# Patient Record
Sex: Female | Born: 1935 | Race: White | Hispanic: No | Smoking: Never smoker
Health system: Southern US, Community
[De-identification: ages and names within clinical notes are randomized; demographics above are authoritative.]

## PROBLEM LIST (undated history)

## (undated) DIAGNOSIS — I480 Paroxysmal atrial fibrillation: Secondary | ICD-10-CM

## (undated) DIAGNOSIS — K219 Gastro-esophageal reflux disease without esophagitis: Secondary | ICD-10-CM

## (undated) DIAGNOSIS — I10 Essential (primary) hypertension: Secondary | ICD-10-CM

## (undated) DIAGNOSIS — I499 Cardiac arrhythmia, unspecified: Secondary | ICD-10-CM

## (undated) DIAGNOSIS — E785 Hyperlipidemia, unspecified: Secondary | ICD-10-CM

## (undated) DIAGNOSIS — M797 Fibromyalgia: Secondary | ICD-10-CM

## (undated) DIAGNOSIS — H409 Unspecified glaucoma: Secondary | ICD-10-CM

## (undated) HISTORY — DX: Essential (primary) hypertension: I10

## (undated) HISTORY — DX: Hyperlipidemia, unspecified: E78.5

## (undated) HISTORY — PX: CHOLECYSTECTOMY: SHX55

## (undated) HISTORY — PX: DILATION AND CURETTAGE OF UTERUS: SHX78

---

## 2001-01-21 ENCOUNTER — Encounter: Payer: Self-pay | Admitting: *Deleted

## 2001-01-21 ENCOUNTER — Emergency Department (HOSPITAL_COMMUNITY): Admission: EM | Admit: 2001-01-21 | Discharge: 2001-01-21 | Payer: Self-pay | Admitting: *Deleted

## 2001-04-14 ENCOUNTER — Other Ambulatory Visit: Admission: RE | Admit: 2001-04-14 | Discharge: 2001-04-14 | Payer: Self-pay | Admitting: Internal Medicine

## 2001-04-15 ENCOUNTER — Encounter: Payer: Self-pay | Admitting: Internal Medicine

## 2001-04-15 ENCOUNTER — Ambulatory Visit (HOSPITAL_COMMUNITY): Admission: RE | Admit: 2001-04-15 | Discharge: 2001-04-15 | Payer: Self-pay | Admitting: Internal Medicine

## 2002-05-27 ENCOUNTER — Other Ambulatory Visit: Admission: RE | Admit: 2002-05-27 | Discharge: 2002-05-27 | Payer: Self-pay | Admitting: Dermatology

## 2003-08-24 ENCOUNTER — Ambulatory Visit (HOSPITAL_COMMUNITY): Admission: RE | Admit: 2003-08-24 | Discharge: 2003-08-24 | Payer: Self-pay | Admitting: Otolaryngology

## 2003-09-28 ENCOUNTER — Ambulatory Visit (HOSPITAL_COMMUNITY): Admission: RE | Admit: 2003-09-28 | Discharge: 2003-09-28 | Payer: Self-pay | Admitting: Otolaryngology

## 2004-06-20 ENCOUNTER — Ambulatory Visit (HOSPITAL_COMMUNITY): Admission: RE | Admit: 2004-06-20 | Discharge: 2004-06-20 | Payer: Self-pay | Admitting: Internal Medicine

## 2005-05-16 ENCOUNTER — Ambulatory Visit: Payer: Self-pay | Admitting: Family Medicine

## 2005-05-31 ENCOUNTER — Ambulatory Visit: Payer: Self-pay | Admitting: Family Medicine

## 2005-06-04 ENCOUNTER — Ambulatory Visit (HOSPITAL_COMMUNITY): Admission: RE | Admit: 2005-06-04 | Discharge: 2005-06-04 | Payer: Self-pay | Admitting: Family Medicine

## 2005-07-06 ENCOUNTER — Ambulatory Visit: Payer: Self-pay | Admitting: Internal Medicine

## 2005-07-24 ENCOUNTER — Ambulatory Visit: Payer: Self-pay | Admitting: Internal Medicine

## 2005-07-24 ENCOUNTER — Ambulatory Visit (HOSPITAL_COMMUNITY): Admission: RE | Admit: 2005-07-24 | Discharge: 2005-07-24 | Payer: Self-pay | Admitting: Internal Medicine

## 2005-08-31 ENCOUNTER — Ambulatory Visit: Payer: Self-pay | Admitting: Family Medicine

## 2005-09-04 ENCOUNTER — Ambulatory Visit (HOSPITAL_COMMUNITY): Admission: RE | Admit: 2005-09-04 | Discharge: 2005-09-04 | Payer: Self-pay | Admitting: Family Medicine

## 2005-09-11 ENCOUNTER — Ambulatory Visit: Payer: Self-pay | Admitting: Internal Medicine

## 2005-11-30 ENCOUNTER — Ambulatory Visit: Payer: Self-pay | Admitting: Family Medicine

## 2006-01-02 ENCOUNTER — Ambulatory Visit (HOSPITAL_COMMUNITY): Admission: RE | Admit: 2006-01-02 | Discharge: 2006-01-02 | Payer: Self-pay | Admitting: Family Medicine

## 2007-02-20 ENCOUNTER — Ambulatory Visit (HOSPITAL_COMMUNITY): Admission: RE | Admit: 2007-02-20 | Discharge: 2007-02-20 | Payer: Self-pay | Admitting: Family Medicine

## 2007-05-19 ENCOUNTER — Ambulatory Visit (HOSPITAL_COMMUNITY): Admission: RE | Admit: 2007-05-19 | Discharge: 2007-05-19 | Payer: Self-pay | Admitting: Ophthalmology

## 2007-06-23 ENCOUNTER — Ambulatory Visit (HOSPITAL_COMMUNITY): Admission: RE | Admit: 2007-06-23 | Discharge: 2007-06-23 | Payer: Self-pay | Admitting: Ophthalmology

## 2008-02-24 ENCOUNTER — Ambulatory Visit (HOSPITAL_COMMUNITY): Admission: RE | Admit: 2008-02-24 | Discharge: 2008-02-24 | Payer: Self-pay | Admitting: Internal Medicine

## 2009-04-28 ENCOUNTER — Ambulatory Visit (HOSPITAL_COMMUNITY): Admission: RE | Admit: 2009-04-28 | Discharge: 2009-04-28 | Payer: Self-pay | Admitting: Internal Medicine

## 2009-11-09 ENCOUNTER — Ambulatory Visit (HOSPITAL_COMMUNITY): Admission: RE | Admit: 2009-11-09 | Discharge: 2009-11-09 | Payer: Self-pay | Admitting: Internal Medicine

## 2011-01-19 NOTE — Consult Note (Signed)
Danielle Vasquez, Danielle Vasquez            ACCOUNT NO.:  [id number]   MEDICAL RECORD NO.:  [id number]          PATIENT TYPE:  AMB   LOCATION:                                FACILITY:  [facility]   PHYSICIAN:  R. Roetta Sessions, M.D. DATE OF BIRTH:  [birth year]   DATE OF CONSULTATION:  07/06/2005  DATE OF DISCHARGE:                                   CONSULTATION   REFERRING PHYSICIAN:  Dorthula Rue. Early Chars, M.D.   REASON FOR CONSULTATION:  Gastroesophageal reflux disease and dysphagia.   HISTORY OF PRESENT ILLNESS:  Danielle Vasquez is a 77 year old, Caucasian female  patient of Dr. Tarri Abernethy who presents today for further evaluation of  above-stated findings.  In addition, she wants to have a screening  colonoscopy.  She has had intermittent gastroesophageal reflux disease  symptoms for a couple of years duration.  She has to be really careful  watching what she eats.  Also, if she overeats, she seems to be more  symptomatic.  She recently saw Dr. Tarri Abernethy to establish care.  She had  been complaining of some trouble swallowing meats and starchy foods feeling  like it gets hung in her throat.  She was also having some indigestion.  She  was started on Prevacid and has noted improvement of these symptoms.  She  denies any nausea, vomiting, abdominal pain, melena or rectal bleeding.  She  is prone to constipation, but usually manages well as long as she watches  her diet.  She has never had a colonoscopy.   In September 2006, she had normal LFTs, TSH and calcium.   CURRENT MEDICATIONS:  1.  Calcium plus D 600 mg b.i.d.  2.  Red yeast rice 600 mg b.i.d.  3.  Vitamin E daily.  4.  Lisinopril/HCTZ 20/25 mg b.i.d.  5.  Fish oil 1200 mg daily.  6.  Vitamin C 1000 daily.  7.  Aspirin 81 mg daily.  8.  Flax seed oil four times a day.  9.  Timolol drops one drop each eye daily.  10. Prevacid 30 mg daily p.r.n.   ALLERGIES:  No known drug allergies.   PAST MEDICAL HISTORY:  1.  Gastroesophageal reflux  disease.  2.  Osteoarthritis.  3.  Hypertension.  4.  Hyperlipidemia.  5.  Status post cholecystectomy.   FAMILY HISTORY:  Mother died in her 65s of kidney failure and diabetes.  Father died at age 78 of MI.  She has a brother and a sister who passed away  from emphysema.  No family history of colorectal cancer.   SOCIAL HISTORY:  She is widowed and a retired Engineer, site.  She has never  been a smoker.  Denies any alcohol use.   REVIEW OF SYSTEMS:  GASTROINTESTINAL:  See HPI.  CARDIOPULMONARY:  No chest  pain or shortness of breath.  CONSTITUTIONAL:  No weight loss.   PHYSICAL EXAMINATION:  VITAL SIGNS:  Weight 126, height 5 feet 1 inch,  temperature 98.2, blood pressure 126/64, pulse 72.  GENERAL:  Pleasant, well-developed, well-nourished, Caucasian female in no  acute distress.  SKIN:  Warm  and dry, no jaundice.  HEENT:  Pupils equal round and reactive to light.  Conjunctivae are pink.  Sclerae nonicteric.  Oropharyngeal mucosa moist and pink.  No lesions,  erythema or exudate.  NECK:  No lymphadenopathy or thyromegaly.  CHEST:  Lungs clear to auscultation.  CARDIAC:  Regular rate and rhythm with normal S1, S2, no murmurs, rubs or  gallops.  ABDOMEN:  Positive bowel sounds.  Soft, nontender, nondistended.  No  organomegaly or masses.  No rebound tenderness or guarding.  No abdominal  bruits or hernias.  EXTREMITIES:  No edema.   IMPRESSION:  Danielle Vasquez is a 77 year old lady with a couple of year history  of intermittent gastroesophageal reflux symptoms as well as dysphagia.  Symptoms have improved on Prevacid therapy.  She has never had an upper  endoscopy.  In addition, she is prone to constipation, but generally manages  fairly well with dietary measures.  She has never had a screening  colonoscopy and is requesting one at this time.   PLAN:  1.  EGD with possible esophageal dilatation.  Colonoscopy in the near      future.  2.  Will have her hold her aspirin x3 days  prior to the procedure.  3.  She will continue Prevacid 30 mg daily.   I would like to thank Dr. Tarri Abernethy for allowing [place] to take part in the  care of this patient.  Dr. Jena Gauss is the co-signer of this note in the  absence of Dr. Karilyn Cota.      Tana Coast, P.AJonathon Bellows, M.D.  Electronically Signed    LL/MEDQ  D:  07/06/2005  T:  07/06/2005  Job:  [id number]

## 2011-01-19 NOTE — Op Note (Signed)
Danielle Vasquez, Danielle Vasquez            ACCOUNT NO.:  [id number]   MEDICAL RECORD NO.:  [id number]          PATIENT TYPE:  AMB   LOCATION:  DAY                           FACILITY:  [facility]   PHYSICIAN:  Lionel December, M.D.    DATE OF BIRTH:  [birth year]   DATE OF PROCEDURE:  07/24/2005  DATE OF DISCHARGE:                                 OPERATIVE REPORT   PROCEDURE:  Esophagogastroduodenoscopy with esophageal dilation followed by  colonoscopy.   INDICATION:  Danielle Vasquez is a 77 year old Caucasian female with chronic GERD  who presents with intermittent solid food dysphagia. While her heartburn to  well-controlled, she has recurrent hoarseness. She is also undergoing a  screening colonoscopy.   Procedure risks were reviewed with the patient, and informed consent was  obtained.   MEDICINES FOR CONSCIOUS SEDATION:  Cetacaine spray for oropharyngeal topical  anesthesia, Demerol 25 mg IV, Versed 4 mg IV in divided dose.   FINDINGS:  Procedure performed in endoscopy suite. The patient's vital signs  and O2 saturation were monitored during the procedures and remained stable.   PROCEDURE #1:  Esophagogastroduodenoscopy. The patient was placed in left  lateral position and Olympus videoscope was passed via oropharynx without  any difficulty into esophagus.   Esophagus. Mucosa of the esophagus was normal. GE junction was at 37 cm and  hiatus at 39. She had focal erythema and noncritical ring at GE junction.   Stomach. It was empty and distended very well with insufflation. Folds of  proximal stomach were normal. Examination of mucosa revealed few antral  erosions but no ulcer crater was found. Pyloric channel was patent.  Angularis, fundus and cardia were examined by retroflexing the scope and  were normal.   Duodenum. Bulbar mucosa was normal. Scope was passed to the second part of  the duodenum where mucosa and folds were normal. Endoscope was withdrawn.   Esophagus was dilated by passing  54-French Maloney dilator to full  insertion. As the dilator was withdrawn, endoscope was passed again, and  ring was noted to have been disrupted, and there was another linear tear at  cervical esophagus indicative of unrecognized esophageal web. Pictures taken  for the record. Endoscope was withdrawn. The patient prepared for procedure  #2.   PROCEDURE #2:  Colonoscopy. Rectal examination performed. No abnormality  noted on external or digital exam. Olympus videoscope was placed in rectum  and advanced under vision into sigmoid colon and beyond. Preparation was  satisfactory. Scope was advanced into cecum which was identified by  appendiceal orifice and ileocecal valve. Pictures taken for the record.  Colonic mucosa was examined carefully on the way out, and no polyps and/or  other abnormalities were noted. Rectal mucosa was normal. Scope was  retroflexed to examine anorectal junction, and moderate-sized hemorrhoids  were noted below the dentate line. Endoscope was straightened and withdrawn.  The patient tolerated the procedure well.   FINAL DIAGNOSIS:  1.  Mild changes of reflux esophagitis limited to GE junction along with      Schatzki's ring which was disrupted by passing 54-French Maloney      dilator.  2.  Esophageal dilation also resulted in linear mucosal tear of cervical      esophagus indicative of esophageal web.  3.  Small sliding hiatal hernia.  4.  Erosive antral gastritis.  5.  Normal colonoscopy except external hemorrhoids.  6.  I suspect her gastroesophageal reflux disease symptoms are not well-      controlled with single-dose PPI.   RECOMMENDATIONS:  1.  H pylori serology be checked today.  2.  Will increase Prevacid to 30 mg b.i.d. She will pick up one month supply      from the office to supplement her prescription.  3.  She will return for OV in one month from now to make sure her GERD      symptoms are well-controlled. Otherwise she will be switched to  another      PPI. She can resume her usual medications.      Lionel December, M.D.  Electronically Signed     NR/MEDQ  D:  07/24/2005  T:  07/24/2005  Job:  [id number]   cc:   Dorthula Rue. Early Chars, MD  Fax: [phone]

## 2011-09-13 DIAGNOSIS — Z23 Encounter for immunization: Secondary | ICD-10-CM | POA: Diagnosis not present

## 2011-09-27 DIAGNOSIS — I10 Essential (primary) hypertension: Secondary | ICD-10-CM | POA: Diagnosis not present

## 2011-09-27 DIAGNOSIS — E119 Type 2 diabetes mellitus without complications: Secondary | ICD-10-CM | POA: Diagnosis not present

## 2011-09-27 DIAGNOSIS — E785 Hyperlipidemia, unspecified: Secondary | ICD-10-CM | POA: Diagnosis not present

## 2011-12-13 DIAGNOSIS — H4010X0 Unspecified open-angle glaucoma, stage unspecified: Secondary | ICD-10-CM | POA: Diagnosis not present

## 2012-01-01 ENCOUNTER — Other Ambulatory Visit (HOSPITAL_COMMUNITY): Payer: Self-pay | Admitting: Internal Medicine

## 2012-01-01 ENCOUNTER — Ambulatory Visit (HOSPITAL_COMMUNITY)
Admission: RE | Admit: 2012-01-01 | Discharge: 2012-01-01 | Disposition: A | Discharge status: Home / Self Care | Payer: Medicare Other | Source: Ambulatory Visit | Attending: Internal Medicine | Admitting: Internal Medicine

## 2012-01-01 DIAGNOSIS — M25569 Pain in unspecified knee: Secondary | ICD-10-CM | POA: Diagnosis not present

## 2012-01-01 DIAGNOSIS — E119 Type 2 diabetes mellitus without complications: Secondary | ICD-10-CM | POA: Diagnosis not present

## 2012-01-01 DIAGNOSIS — I10 Essential (primary) hypertension: Secondary | ICD-10-CM | POA: Diagnosis not present

## 2012-01-01 DIAGNOSIS — E785 Hyperlipidemia, unspecified: Secondary | ICD-10-CM | POA: Diagnosis not present

## 2012-01-01 DIAGNOSIS — M25869 Other specified joint disorders, unspecified knee: Secondary | ICD-10-CM | POA: Diagnosis not present

## 2012-01-01 DIAGNOSIS — M1710 Unilateral primary osteoarthritis, unspecified knee: Secondary | ICD-10-CM | POA: Diagnosis not present

## 2012-01-16 ENCOUNTER — Ambulatory Visit (INDEPENDENT_AMBULATORY_CARE_PROVIDER_SITE_OTHER): Payer: Medicare Other | Admitting: Orthopedic Surgery

## 2012-01-16 ENCOUNTER — Encounter: Payer: Self-pay | Admitting: Orthopedic Surgery

## 2012-01-16 VITALS — BP 128/60 | Ht 61.0 in | Wt 133.0 lb

## 2012-01-16 DIAGNOSIS — M7052 Other bursitis of knee, left knee: Secondary | ICD-10-CM

## 2012-01-16 DIAGNOSIS — IMO0002 Reserved for concepts with insufficient information to code with codable children: Secondary | ICD-10-CM

## 2012-01-16 DIAGNOSIS — M7051 Other bursitis of knee, right knee: Secondary | ICD-10-CM | POA: Insufficient documentation

## 2012-01-16 NOTE — Progress Notes (Signed)
  Subjective:    Danielle Vasquez is a 78 y.o. female who presents bilateral knee pain over the medial aspect of both knees since March of this year. Her symptoms started gradually around relieved by Tylenol and by anti-inflammatory medications. Her symptoms seem to come and go they're worse in the morning she reports warmth and swelling especially on the right side and reports that the right side is worse. She denies catching locking numbness or tingling  Review of systems positive for joint pain and stiffness the other systems are reported as normal  The following portions of the patient's history were reviewed and updated as appropriate: allergies, current medications, past family history, past medical history, past social history, past surgical history and problem list.   Review of Systems Pertinent items are noted in HPI.   Objective:    BP 128/60  Ht 5\' 1"  (1.549 m)  Wt 133 lb (60.328 kg)  BMI 25.13 kg/m2  Vital signs are stable as recorded  General appearance is normal  The patient is alert and oriented x3  The patient's mood and affect are normal  Gait assessment: Normal ambulation no assistive devices The cardiovascular exam reveals normal pulses and temperature without edema swelling.  The lymphatic system is negative for palpable lymph nodes  The sensory exam is normal.  There are no pathologic reflexes.  Balance is normal.   Exam of the right knee Inspection the medial bursa is swollen and tender there is some tenderness along the joint line there is no joint effusion. Range of motion arc is been maintained to greater than 125. The knee is stable strength is normal the skin is intact  The left knee is examined as well full range of motion is noted there is no instability strength is normal skin is intact the medial joint line is mildly tender the medial bursa is also mildly tender there is no joint effusion          Bilateral knee x-rays obtained from the  hospital show degenerative joint disease  Assessment:    Bilateral pes anserine knee bursitis    Plan:    Right knee bursitis injection   Knee  Injection Procedure Note  Pre-operative Diagnosis: bursitis right knee Post-operative Diagnosis: same  Indications: Pain  Anesthesia: Ethyl chloride Procedure Details   The right knee was prepped with ethyl chloride and alcohol and then a 25-gauge needle was used to inject 40 mg Depo-Medrol and 3 cc 1% lidocaine into the right has anserine bursa  No complications were noted sterile dressing was applied

## 2012-01-16 NOTE — Patient Instructions (Signed)
Ice 3 x a day for the next 2 weeks   You have received a steroid shot. 15% of patients experience increased pain at the injection site with in the next 24 hours. This is best treated with ice and tylenol extra strength 2 tabs every 8 hours. If you are still having pain please call the office.

## 2012-07-23 DIAGNOSIS — H538 Other visual disturbances: Secondary | ICD-10-CM | POA: Diagnosis not present

## 2012-07-23 DIAGNOSIS — H2510 Age-related nuclear cataract, unspecified eye: Secondary | ICD-10-CM | POA: Diagnosis not present

## 2012-07-23 DIAGNOSIS — H4010X0 Unspecified open-angle glaucoma, stage unspecified: Secondary | ICD-10-CM | POA: Diagnosis not present

## 2012-08-11 DIAGNOSIS — H40009 Preglaucoma, unspecified, unspecified eye: Secondary | ICD-10-CM | POA: Diagnosis not present

## 2012-08-12 ENCOUNTER — Encounter (HOSPITAL_COMMUNITY): Payer: Self-pay | Admitting: Pharmacy Technician

## 2012-08-18 DIAGNOSIS — H4010X0 Unspecified open-angle glaucoma, stage unspecified: Secondary | ICD-10-CM | POA: Diagnosis not present

## 2012-08-22 DIAGNOSIS — E785 Hyperlipidemia, unspecified: Secondary | ICD-10-CM | POA: Diagnosis not present

## 2012-08-22 DIAGNOSIS — R7309 Other abnormal glucose: Secondary | ICD-10-CM | POA: Diagnosis not present

## 2012-08-22 DIAGNOSIS — R5381 Other malaise: Secondary | ICD-10-CM | POA: Diagnosis not present

## 2012-08-22 DIAGNOSIS — I10 Essential (primary) hypertension: Secondary | ICD-10-CM | POA: Diagnosis not present

## 2012-08-22 DIAGNOSIS — K219 Gastro-esophageal reflux disease without esophagitis: Secondary | ICD-10-CM | POA: Diagnosis not present

## 2012-08-22 NOTE — Patient Instructions (Addendum)
Your procedure is scheduled on: 09/01/2012  Report to [facility] at 800  AM.  Call this number if you have problems the morning of surgery: [phone]   Do not eat food or drink liquids :After Midnight.      Take these medicines the morning of surgery with A SIP OF WATER: lisinopril,toprol,prilosec   Do not wear jewelry, make-up or nail polish.  Do not wear lotions, powders, or perfumes. You may wear deodorant.  Do not shave 48 hours prior to surgery.  Do not bring valuables to the hospital.  Contacts, dentures or bridgework may not be worn into surgery.  Leave suitcase in the car. After surgery it may be brought to your room.  For patients admitted to the hospital, checkout time is 11:00 AM the day of discharge.   Patients discharged the day of surgery will not be allowed to drive home.  :     Please read over the following fact sheets that you were given: Coughing and Deep Breathing, Surgical Site Infection Prevention, Anesthesia Post-op Instructions and Care and Recovery After Surgery    Cataract A cataract is a clouding of the lens of the eye. When a lens becomes cloudy, vision is reduced based on the degree and nature of the clouding. Many cataracts reduce vision to some degree. Some cataracts make people more near-sighted as they develop. Other cataracts increase glare. Cataracts that are ignored and become worse can sometimes look white. The white color can be seen through the pupil. CAUSES   Aging. However, cataracts may occur at any age, even in newborns.   Certain drugs.   Trauma to the eye.   Certain diseases such as diabetes.   Specific eye diseases such as chronic inflammation inside the eye or a sudden attack of a rare form of glaucoma.   Inherited or acquired medical problems.  SYMPTOMS   Gradual, progressive drop in vision in the affected eye.   Severe, rapid visual loss. This most often happens when trauma is the cause.  DIAGNOSIS  To detect a cataract, an  eye doctor examines the lens. Cataracts are best diagnosed with an exam of the eyes with the pupils enlarged (dilated) by drops.  TREATMENT  For an early cataract, vision may improve by using different eyeglasses or stronger lighting. If that does not help your vision, surgery is the only effective treatment. A cataract needs to be surgically removed when vision loss interferes with your everyday activities, such as driving, reading, or watching TV. A cataract may also have to be removed if it prevents examination or treatment of another eye problem. Surgery removes the cloudy lens and usually replaces it with a substitute lens (intraocular lens, IOL).  At a time when both you and your doctor agree, the cataract will be surgically removed. If you have cataracts in both eyes, only one is usually removed at a time. This allows the operated eye to heal and be out of danger from any possible problems after surgery (such as infection or poor wound healing). In rare cases, a cataract may be doing damage to your eye. In these cases, your caregiver may advise surgical removal right away. The vast majority of people who have cataract surgery have better vision afterward. HOME CARE INSTRUCTIONS  If you are not planning surgery, you may be asked to do the following:  Use different eyeglasses.   Use stronger or brighter lighting.   Ask your eye doctor about reducing your medicine dose or changing  medicines if it is thought that a medicine caused your cataract. Changing medicines does not make the cataract go away on its own.   Become familiar with your surroundings. Poor vision can lead to injury. Avoid bumping into things on the affected side. You are at a higher risk for tripping or falling.   Exercise extreme care when driving or operating machinery.   Wear sunglasses if you are sensitive to bright light or experiencing problems with glare.  SEEK IMMEDIATE MEDICAL CARE IF:   You have a worsening or sudden  vision loss.   You notice redness, swelling, or increasing pain in the eye.   You have a fever.  Document Released: 08/20/2005 Document Revised: 08/09/2011 Document Reviewed: 04/13/2011 [facility] Patient Information 2012 [place], [place].PATIENT INSTRUCTIONS POST-ANESTHESIA  IMMEDIATELY FOLLOWING SURGERY:  Do not drive or operate machinery for the first twenty four hours after surgery.  Do not make any important decisions for twenty four hours after surgery or while taking narcotic pain medications or sedatives.  If you develop intractable nausea and vomiting or a severe headache please notify your doctor immediately.  FOLLOW-UP:  Please make an appointment with your surgeon as instructed. You do not need to follow up with anesthesia unless specifically instructed to do so.  WOUND CARE INSTRUCTIONS (if applicable):  Keep a dry clean dressing on the anesthesia/puncture wound site if there is drainage.  Once the wound has quit draining you may leave it open to air.  Generally you should leave the bandage intact for twenty four hours unless there is drainage.  If the epidural site drains for more than 36-48 hours please call the anesthesia department.  QUESTIONS?:  Please feel free to call your physician or the hospital operator if you have any questions, and they will be happy to assist you.

## 2012-08-25 ENCOUNTER — Other Ambulatory Visit: Payer: Self-pay

## 2012-08-25 ENCOUNTER — Encounter (HOSPITAL_COMMUNITY): Payer: Self-pay

## 2012-08-25 ENCOUNTER — Encounter (HOSPITAL_COMMUNITY)
Admission: RE | Admit: 2012-08-25 | Discharge: 2012-08-25 | Disposition: A | Discharge status: Home / Self Care | Payer: Medicare Other | Source: Ambulatory Visit | Attending: Ophthalmology | Admitting: Ophthalmology

## 2012-08-25 DIAGNOSIS — H2510 Age-related nuclear cataract, unspecified eye: Secondary | ICD-10-CM | POA: Diagnosis not present

## 2012-08-25 DIAGNOSIS — I10 Essential (primary) hypertension: Secondary | ICD-10-CM | POA: Diagnosis not present

## 2012-08-25 DIAGNOSIS — H538 Other visual disturbances: Secondary | ICD-10-CM | POA: Diagnosis not present

## 2012-08-25 DIAGNOSIS — Z01812 Encounter for preprocedural laboratory examination: Secondary | ICD-10-CM | POA: Diagnosis not present

## 2012-08-25 DIAGNOSIS — Z01810 Encounter for preprocedural cardiovascular examination: Secondary | ICD-10-CM | POA: Diagnosis not present

## 2012-08-25 HISTORY — DX: Gastro-esophageal reflux disease without esophagitis: K21.9

## 2012-08-25 HISTORY — DX: Cardiac arrhythmia, unspecified: I49.9

## 2012-08-25 HISTORY — DX: Unspecified glaucoma: H40.9

## 2012-08-25 HISTORY — DX: Fibromyalgia: M79.7

## 2012-08-25 LAB — HEMOGLOBIN AND HEMATOCRIT, BLOOD
HCT: 40.5 % (ref 36.0–46.0)
Hemoglobin: 14.5 g/dL (ref 12.0–15.0)

## 2012-08-25 LAB — BASIC METABOLIC PANEL
BUN: 33 mg/dL — ABNORMAL HIGH (ref 6–23)
CO2: 31 mEq/L (ref 19–32)
Calcium: 10.5 mg/dL (ref 8.4–10.5)
Chloride: 100 mEq/L (ref 96–112)
Creatinine, Ser: 1.05 mg/dL (ref 0.50–1.10)
GFR calc Af Amer: 56 mL/min — ABNORMAL LOW (ref >90)
GFR calc non Af Amer: 48 mL/min — ABNORMAL LOW (ref >90)
Glucose, Bld: 125 mg/dL — ABNORMAL HIGH (ref 70–99)
Potassium: 4.1 mEq/L (ref 3.5–5.1)
Sodium: 139 mEq/L (ref 135–145)

## 2012-08-25 MED ORDER — CYCLOPENTOLATE-PHENYLEPHRINE 0.2-1 % OP SOLN
OPHTHALMIC | Status: AC
  Filled 2012-08-25: qty 2

## 2012-09-01 ENCOUNTER — Encounter (HOSPITAL_COMMUNITY): Payer: Self-pay | Admitting: Anesthesiology

## 2012-09-01 ENCOUNTER — Encounter (HOSPITAL_COMMUNITY): Payer: Self-pay

## 2012-09-01 ENCOUNTER — Encounter (HOSPITAL_COMMUNITY)
Admission: RE | Disposition: A | Discharge status: Home / Self Care | Payer: Self-pay | Source: Ambulatory Visit | Attending: Ophthalmology

## 2012-09-01 ENCOUNTER — Ambulatory Visit (HOSPITAL_COMMUNITY)
Admission: RE | Admit: 2012-09-01 | Discharge: 2012-09-01 | Disposition: A | Discharge status: Home / Self Care | Payer: Medicare Other | Source: Ambulatory Visit | Attending: Ophthalmology | Admitting: Ophthalmology

## 2012-09-01 ENCOUNTER — Ambulatory Visit (HOSPITAL_COMMUNITY): Payer: Medicare Other | Admitting: Anesthesiology

## 2012-09-01 DIAGNOSIS — Z01812 Encounter for preprocedural laboratory examination: Secondary | ICD-10-CM | POA: Insufficient documentation

## 2012-09-01 DIAGNOSIS — H2510 Age-related nuclear cataract, unspecified eye: Secondary | ICD-10-CM | POA: Insufficient documentation

## 2012-09-01 DIAGNOSIS — Z01810 Encounter for preprocedural cardiovascular examination: Secondary | ICD-10-CM | POA: Diagnosis not present

## 2012-09-01 DIAGNOSIS — I10 Essential (primary) hypertension: Secondary | ICD-10-CM | POA: Insufficient documentation

## 2012-09-01 DIAGNOSIS — H269 Unspecified cataract: Secondary | ICD-10-CM | POA: Diagnosis not present

## 2012-09-01 DIAGNOSIS — H538 Other visual disturbances: Secondary | ICD-10-CM | POA: Diagnosis not present

## 2012-09-01 HISTORY — PX: CATARACT EXTRACTION W/PHACO: SHX586

## 2012-09-01 SURGERY — PHACOEMULSIFICATION, CATARACT, WITH IOL INSERTION
Anesthesia: Monitor Anesthesia Care | Site: Eye | Laterality: Right | Wound class: Clean

## 2012-09-01 MED ORDER — LIDOCAINE 3.5 % OP GEL OPTIME - NO CHARGE
OPHTHALMIC | Status: DC | PRN
  Administered 2012-09-01: 2 [drp] via OPHTHALMIC

## 2012-09-01 MED ORDER — CYCLOPENTOLATE-PHENYLEPHRINE 0.2-1 % OP SOLN
1.0000 [drp] | Freq: Once | OPHTHALMIC | Status: AC
  Administered 2012-09-01: 1 [drp] via OPHTHALMIC

## 2012-09-01 MED ORDER — MIDAZOLAM HCL 5 MG/5ML IJ SOLN
INTRAMUSCULAR | Status: DC | PRN
  Administered 2012-09-01: 1 mg via INTRAVENOUS

## 2012-09-01 MED ORDER — NA HYALUR & NA CHOND-NA HYALUR 0.55-0.5 ML IO KIT
PACK | INTRAOCULAR | Status: DC | PRN
  Administered 2012-09-01: 1 via OPHTHALMIC

## 2012-09-01 MED ORDER — MIDAZOLAM HCL 2 MG/2ML IJ SOLN
INTRAMUSCULAR | Status: AC
  Filled 2012-09-01: qty 2

## 2012-09-01 MED ORDER — BSS IO SOLN
INTRAOCULAR | Status: DC | PRN
  Administered 2012-09-01: 15 mL via INTRAOCULAR

## 2012-09-01 MED ORDER — TETRACAINE 0.5 % OP SOLN OPTIME - NO CHARGE
OPHTHALMIC | Status: DC | PRN
  Administered 2012-09-01: 2 [drp] via OPHTHALMIC

## 2012-09-01 MED ORDER — TETRACAINE HCL 0.5 % OP SOLN
OPHTHALMIC | Status: AC
  Filled 2012-09-01: qty 2

## 2012-09-01 MED ORDER — TETRACAINE HCL 0.5 % OP SOLN
1.0000 [drp] | Freq: Once | OPHTHALMIC | Status: AC
  Administered 2012-09-01: 1 [drp] via OPHTHALMIC

## 2012-09-01 MED ORDER — CYCLOPENTOLATE-PHENYLEPHRINE 0.2-1 % OP SOLN
OPHTHALMIC | Status: AC
  Filled 2012-09-01: qty 2

## 2012-09-01 MED ORDER — LIDOCAINE HCL 3.5 % OP GEL
OPHTHALMIC | Status: AC
  Filled 2012-09-01: qty 5

## 2012-09-01 MED ORDER — EPINEPHRINE HCL 1 MG/ML IJ SOLN
INTRAOCULAR | Status: DC | PRN
  Administered 2012-09-01: 10:00:00

## 2012-09-01 MED ORDER — LACTATED RINGERS IV SOLN
INTRAVENOUS | Status: DC
  Administered 2012-09-01: 09:00:00 via INTRAVENOUS

## 2012-09-01 MED ORDER — KETOROLAC TROMETHAMINE 0.5 % OP SOLN
1.0000 [drp] | Freq: Once | OPHTHALMIC | Status: AC
  Administered 2012-09-01: 1 [drp] via OPHTHALMIC

## 2012-09-01 MED ORDER — GATIFLOXACIN 0.5 % OP SOLN OPTIME - NO CHARGE
OPHTHALMIC | Status: DC | PRN
  Administered 2012-09-01: 1 [drp] via OPHTHALMIC

## 2012-09-01 MED ORDER — MIDAZOLAM HCL 2 MG/2ML IJ SOLN
1.0000 mg | INTRAMUSCULAR | Status: DC | PRN
  Administered 2012-09-01: 2 mg via INTRAVENOUS
  Administered 2012-09-01: 1 mg via INTRAVENOUS

## 2012-09-01 MED ORDER — LIDOCAINE HCL 3.5 % OP GEL: 1.0000 "application " | Freq: Once | OPHTHALMIC | Status: DC

## 2012-09-01 MED ORDER — EPINEPHRINE HCL 1 MG/ML IJ SOLN
INTRAMUSCULAR | Status: AC
  Filled 2012-09-01: qty 1

## 2012-09-01 MED ORDER — GATIFLOXACIN 0.5 % OP SOLN
1.0000 [drp] | Freq: Once | OPHTHALMIC | Status: AC
  Administered 2012-09-01: 1 [drp] via OPHTHALMIC

## 2012-09-01 SURGICAL SUPPLY — 8 items
CLOTH BEACON ORANGE TIMEOUT ST (SAFETY) ×1 IMPLANT
GLOVE BIOGEL PI IND STRL 6.5 (GLOVE) IMPLANT
GLOVE BIOGEL PI INDICATOR 6.5 (GLOVE) ×1
GLOVE EXAM NITRILE MD LF STRL (GLOVE) ×1 IMPLANT
INST SET CATARACT ~~LOC~~ (KITS) ×2 IMPLANT
PAD ARMBOARD 7.5X6 YLW CONV (MISCELLANEOUS) ×1 IMPLANT
SIGHTPATH CAT PROC W REG LENS (Ophthalmic Related) ×2 IMPLANT
WATER STERILE IRR 250ML POUR (IV SOLUTION) ×1 IMPLANT

## 2012-09-01 NOTE — Anesthesia Postprocedure Evaluation (Signed)
  Anesthesia Post-op Note  Patient: Danielle Vasquez  Procedure(s) Performed: Procedure(s) (LRB): CATARACT EXTRACTION PHACO AND INTRAOCULAR LENS PLACEMENT (IOC) (Right)  Patient Location:  Short Stay  Anesthesia Type: MAC  Level of Consciousness: awake  Airway and Oxygen Therapy: Patient Spontanous Breathing  Post-op Pain: none  Post-op Assessment: Post-op Vital signs reviewed, Patient's Cardiovascular Status Stable, Respiratory Function Stable, Patent Airway, No signs of Nausea or vomiting and Pain level controlled  Post-op Vital Signs: Reviewed and stable  Complications: No apparent anesthesia complications

## 2012-09-01 NOTE — Anesthesia Procedure Notes (Signed)
Procedure Name: MAC Date/Time: 09/01/2012 9:42 AM Performed by: Franco Nones Pre-anesthesia Checklist: Patient identified, Emergency Drugs available, Suction available, Timeout performed and Patient being monitored Patient Re-evaluated:Patient Re-evaluated prior to inductionOxygen Delivery Method: Nasal Cannula

## 2012-09-01 NOTE — Transfer of Care (Signed)
Immediate Anesthesia Transfer of Care Note  Patient: Danielle Vasquez  Procedure(s) Performed: Procedure(s) (LRB): CATARACT EXTRACTION PHACO AND INTRAOCULAR LENS PLACEMENT (IOC) (Right)  Patient Location: Shortstay  Anesthesia Type: MAC  Level of Consciousness: awake  Airway & Oxygen Therapy: Patient Spontanous Breathing   Post-op Assessment: Report given to PACU RN, Post -op Vital signs reviewed and stable and Patient moving all extremities  Post vital signs: Reviewed and stable  Complications: No apparent anesthesia complications

## 2012-09-01 NOTE — Op Note (Signed)
See scanned op note done today 

## 2012-09-01 NOTE — Anesthesia Preprocedure Evaluation (Signed)
Anesthesia Evaluation  Patient identified by MRN, date of birth, ID band Patient awake    Reviewed: Allergy & Precautions, H&P , NPO status , Patient's Chart, lab work & pertinent test results  Airway Mallampati: II TM Distance: >3 FB     Dental  (+) Edentulous Lower   Pulmonary neg pulmonary ROS,  breath sounds clear to auscultation        Cardiovascular hypertension, Pt. on medications + dysrhythmias Rhythm:Regular     Neuro/Psych  Neuromuscular disease    GI/Hepatic GERD-  Medicated,  Endo/Other    Renal/GU      Musculoskeletal  (+) Fibromyalgia -  Abdominal   Peds  Hematology   Anesthesia Other Findings   Reproductive/Obstetrics                           Anesthesia Physical Anesthesia Plan  ASA: III  Anesthesia Plan: MAC   Post-op Pain Management:    Induction: Intravenous  Airway Management Planned: Nasal Cannula  Additional Equipment:   Intra-op Plan:   Post-operative Plan:   Informed Consent: I have reviewed the patients History and Physical, chart, labs and discussed the procedure including the risks, benefits and alternatives for the proposed anesthesia with the patient or authorized representative who has indicated his/her understanding and acceptance.     Plan Discussed with:   Anesthesia Plan Comments:         Anesthesia Quick Evaluation

## 2012-09-01 NOTE — H&P (Signed)
I have reviewed the pre printed H&P, the patient was re-examined, and I have identified no significant interval changes in the patient's medical condition.  There is no change in the plan of care since the history and physical of record. 

## 2012-09-01 NOTE — Brief Op Note (Signed)
09/01/2012  10:12 AM  PATIENT:  Danielle Vasquez  78 y.o. female  PRE-OPERATIVE DIAGNOSIS:  nuclear cataract right eye  POST-OPERATIVE DIAGNOSIS:  nuclear cataract right eye  PROCEDURE:  Procedure(s): CATARACT EXTRACTION PHACO AND INTRAOCULAR LENS PLACEMENT (IOC)  SURGEON:  Surgeon(s): Susa Simmonds, MD  ASSISTANTS: Marya Landry, CST   ANESTHESIA STAFF: Franco Nones, CRNA - CRNA Laurene Footman, MD - Anesthesiologist  ANESTHESIA:   topical and MAC  REQUESTED LENS POWER: 22.0  LENS IMPLANT INFORMATION:  [employer] [id number]  S/n [id number]  Exp 03/2017  CUMULATIVE DISSIPATED ENERGY:11.36  INDICATIONS:see H&P  OP FINDINGS:dense NS  COMPLICATIONS:None  DICTATION #: see scanned op note  PLAN OF CARE: see H&P  PATIENT DISPOSITION:  Short Stay

## 2012-09-05 ENCOUNTER — Encounter (HOSPITAL_COMMUNITY): Payer: Self-pay | Admitting: Ophthalmology

## 2012-10-29 ENCOUNTER — Encounter (HOSPITAL_COMMUNITY): Payer: Self-pay | Admitting: Pharmacy Technician

## 2012-10-30 ENCOUNTER — Encounter (HOSPITAL_COMMUNITY): Payer: Self-pay

## 2012-10-30 ENCOUNTER — Encounter (HOSPITAL_COMMUNITY)
Admission: RE | Admit: 2012-10-30 | Discharge: 2012-10-30 | Disposition: A | Discharge status: Home / Self Care | Payer: Medicare Other | Source: Ambulatory Visit | Attending: Ophthalmology | Admitting: Ophthalmology

## 2012-10-30 MED ORDER — CYCLOPENTOLATE-PHENYLEPHRINE 0.2-1 % OP SOLN
OPHTHALMIC | Status: AC
  Filled 2012-10-30: qty 2

## 2012-10-30 MED ORDER — FENTANYL CITRATE 0.05 MG/ML IJ SOLN: 25.0000 ug | INTRAMUSCULAR | Status: DC | PRN

## 2012-10-30 MED ORDER — ONDANSETRON HCL 4 MG/2ML IJ SOLN: 4.0000 mg | Freq: Once | INTRAMUSCULAR | Status: AC | PRN

## 2012-10-30 NOTE — Pre-Procedure Instructions (Signed)
Dr Jayme Cloud aware labs done in [facility] to use these.

## 2012-10-30 NOTE — Patient Instructions (Addendum)
Your procedure is scheduled on:  11/03/12  Report to [facility] at 7:00 AM.  Call this number if you have problems the morning of surgery: [phone]   Remember:   Do not eat or drink:After Midnight.  Take these medicines the morning of surgery with A SIP OF WATER: Lisinopril-HCTZ, Metoprolol and Omeprazole.   Do not wear jewelry, make-up or nail polish.  Do not wear lotions, powders, or perfumes. You may wear deodorant.  Do not shave 48 hours prior to surgery. Men may shave face and neck.  Do not bring valuables to the hospital.  Contacts, dentures or bridgework may not be worn into surgery.  Leave suitcase in the car. After surgery it may be brought to your room.  For patients admitted to the hospital, checkout time is 11:00 AM the day of discharge.   Patients discharged the day of surgery will not be allowed to drive home.    Special Instructions: Start using your eye drops before surgery as directed by your eye doctor.   Please read over the following fact sheets that you were given: Anesthesia Post-op Instructions    Cataract Surgery  A cataract is a clouding of the lens of the eye. When a lens becomes cloudy, vision is reduced based on the degree and nature of the clouding. Surgery may be needed to improve vision. Surgery removes the cloudy lens and usually replaces it with a substitute lens (intraocular lens, IOL). LET YOUR EYE DOCTOR KNOW ABOUT:  Allergies to food or medicine.  Medicines taken including herbs, eyedrops, over-the-counter medicines, and creams.  Use of steroids (by mouth or creams).  Previous problems with anesthetics or numbing medicine.  History of bleeding problems or blood clots.  Previous surgery.  Other health problems, including diabetes and kidney problems.  Possibility of pregnancy, if this applies. RISKS AND COMPLICATIONS  Infection.  Inflammation of the eyeball (endophthalmitis) that can spread to both eyes (sympathetic  ophthalmia).  Poor wound healing.  If an IOL is inserted, it can later fall out of proper position. This is very uncommon.  Clouding of the part of your eye that holds an IOL in place. This is called an "after-cataract." These are uncommon, but easily treated. BEFORE THE PROCEDURE  Do not eat or drink anything except small amounts of water for 8 to 12 before your surgery, or as directed by your caregiver.  Unless you are told otherwise, continue any eyedrops you have been prescribed.  Talk to your primary caregiver about all other medicines that you take (both prescription and non-prescription). In some cases, you may need to stop or change medicines near the time of your surgery. This is most important if you are taking blood-thinning medicine.Do not stop medicines unless you are told to do so.  Arrange for someone to drive you to and from the procedure.  Do not put contact lenses in either eye on the day of your surgery. PROCEDURE There is more than one method for safely removing a cataract. Your doctor can explain the differences and help determine which is best for you. Phacoemulsification surgery is the most common form of cataract surgery.  An injection is given behind the eye or eyedrops are given to make this a painless procedure.  A small cut (incision) is made on the edge of the clear, dome-shaped surface that covers the front of the eye (cornea).  A tiny probe is painlessly inserted into the eye. This device gives off ultrasound waves that soften and break  up the cloudy center of the lens. This makes it easier for the cloudy lens to be removed by suction.  An IOL may be implanted.  The normal lens of the eye is covered by a clear capsule. Part of that capsule is intentionally left in the eye to support the IOL.  Your surgeon may or may not use stitches to close the incision. There are other forms of cataract surgery that require a larger incision and stiches to close the  eye. This approach is taken in cases where the doctor feels that the cataract cannot be easily removed using phacoemulsification. AFTER THE PROCEDURE  When an IOL is implanted, it does not need care. It becomes a permanent part of your eye and cannot be seen or felt.  Your doctor will schedule follow-up exams to check on your progress.  Review your other medicines with your doctor to see which can be resumed after surgery.  Use eyedrops or take medicine as prescribed by your doctor. Document Released: 08/09/2011 Document Revised: 11/12/2011 Document Reviewed: 08/09/2011 [facility] Patient Information 2013 [place], [place].    PATIENT INSTRUCTIONS POST-ANESTHESIA  IMMEDIATELY FOLLOWING SURGERY:  Do not drive or operate machinery for the first twenty four hours after surgery.  Do not make any important decisions for twenty four hours after surgery or while taking narcotic pain medications or sedatives.  If you develop intractable nausea and vomiting or a severe headache please notify your doctor immediately.  FOLLOW-UP:  Please make an appointment with your surgeon as instructed. You do not need to follow up with anesthesia unless specifically instructed to do so.  WOUND CARE INSTRUCTIONS (if applicable):  Keep a dry clean dressing on the anesthesia/puncture wound site if there is drainage.  Once the wound has quit draining you may leave it open to air.  Generally you should leave the bandage intact for twenty four hours unless there is drainage.  If the epidural site drains for more than 36-48 hours please call the anesthesia department.  QUESTIONS?:  Please feel free to call your physician or the hospital operator if you have any questions, and they will be happy to assist you.

## 2012-10-31 MED ORDER — TETRACAINE HCL 0.5 % OP SOLN
OPHTHALMIC | Status: AC
  Filled 2012-10-31: qty 2

## 2012-10-31 MED ORDER — TETRACAINE HCL 0.5 % OP SOLN
1.0000 [drp] | Freq: Once | OPHTHALMIC | Status: DC
Start: 2012-10-31 — End: 2012-10-31

## 2012-10-31 MED ORDER — LIDOCAINE HCL 3.5 % OP GEL
OPHTHALMIC | Status: AC
  Filled 2012-10-31: qty 5

## 2012-11-03 ENCOUNTER — Ambulatory Visit (HOSPITAL_COMMUNITY)
Admission: RE | Admit: 2012-11-03 | Discharge: 2012-11-03 | Disposition: A | Discharge status: Home / Self Care | Payer: Medicare Other | Source: Ambulatory Visit | Attending: Ophthalmology | Admitting: Ophthalmology

## 2012-11-03 ENCOUNTER — Ambulatory Visit (HOSPITAL_COMMUNITY): Payer: Medicare Other | Admitting: Anesthesiology

## 2012-11-03 ENCOUNTER — Encounter (HOSPITAL_COMMUNITY): Payer: Self-pay | Admitting: *Deleted

## 2012-11-03 ENCOUNTER — Encounter (HOSPITAL_COMMUNITY)
Admission: RE | Disposition: A | Discharge status: Home / Self Care | Payer: Self-pay | Source: Ambulatory Visit | Attending: Ophthalmology

## 2012-11-03 ENCOUNTER — Encounter (HOSPITAL_COMMUNITY): Payer: Self-pay | Admitting: Anesthesiology

## 2012-11-03 DIAGNOSIS — I10 Essential (primary) hypertension: Secondary | ICD-10-CM | POA: Insufficient documentation

## 2012-11-03 DIAGNOSIS — H269 Unspecified cataract: Secondary | ICD-10-CM | POA: Diagnosis not present

## 2012-11-03 DIAGNOSIS — H2510 Age-related nuclear cataract, unspecified eye: Secondary | ICD-10-CM | POA: Diagnosis not present

## 2012-11-03 DIAGNOSIS — Z79899 Other long term (current) drug therapy: Secondary | ICD-10-CM | POA: Insufficient documentation

## 2012-11-03 HISTORY — PX: CATARACT EXTRACTION W/PHACO: SHX586

## 2012-11-03 SURGERY — PHACOEMULSIFICATION, CATARACT, WITH IOL INSERTION
Anesthesia: Monitor Anesthesia Care | Site: Eye | Laterality: Left | Wound class: Clean

## 2012-11-03 MED ORDER — EPINEPHRINE HCL 1 MG/ML IJ SOLN
INTRAOCULAR | Status: DC | PRN
  Administered 2012-11-03: 08:00:00

## 2012-11-03 MED ORDER — GATIFLOXACIN 0.5 % OP SOLN OPTIME - NO CHARGE
1.0000 [drp] | Freq: Once | OPHTHALMIC | Status: AC
  Administered 2012-11-03: 1 [drp] via OPHTHALMIC
  Filled 2012-11-03: qty 2.5

## 2012-11-03 MED ORDER — EPINEPHRINE HCL 1 MG/ML IJ SOLN
INTRAMUSCULAR | Status: AC
  Filled 2012-11-03: qty 1

## 2012-11-03 MED ORDER — KETOROLAC TROMETHAMINE 0.4 % OP SOLN - NO CHARGE
1.0000 [drp] | Freq: Once | OPHTHALMIC | Status: AC
  Administered 2012-11-03: 1 [drp] via OPHTHALMIC
  Filled 2012-11-03: qty 5

## 2012-11-03 MED ORDER — LIDOCAINE 3.5 % OP GEL OPTIME - NO CHARGE
OPHTHALMIC | Status: DC | PRN
  Administered 2012-11-03: 2 [drp] via OPHTHALMIC

## 2012-11-03 MED ORDER — TETRACAINE 0.5 % OP SOLN OPTIME - NO CHARGE
OPHTHALMIC | Status: DC | PRN
  Administered 2012-11-03: 2 [drp] via OPHTHALMIC

## 2012-11-03 MED ORDER — NA HYALUR & NA CHOND-NA HYALUR 0.55-0.5 ML IO KIT
PACK | INTRAOCULAR | Status: DC | PRN
  Administered 2012-11-03: 1 via OPHTHALMIC

## 2012-11-03 MED ORDER — CYCLOPENTOLATE-PHENYLEPHRINE 0.2-1 % OP SOLN
1.0000 [drp] | Freq: Once | OPHTHALMIC | Status: AC
  Administered 2012-11-03: 1 [drp] via OPHTHALMIC

## 2012-11-03 MED ORDER — BSS IO SOLN
INTRAOCULAR | Status: DC | PRN
  Administered 2012-11-03: 15 mL via INTRAOCULAR

## 2012-11-03 MED ORDER — LACTATED RINGERS IV SOLN
INTRAVENOUS | Status: DC
  Administered 2012-11-03: 08:00:00 via INTRAVENOUS

## 2012-11-03 MED ORDER — MIDAZOLAM HCL 2 MG/2ML IJ SOLN
1.0000 mg | INTRAMUSCULAR | Status: DC | PRN
  Administered 2012-11-03: 2 mg via INTRAVENOUS

## 2012-11-03 MED ORDER — MOXIFLOXACIN HCL 0.5 % OP SOLN - NO CHARGE
1.0000 [drp] | Freq: Once | OPHTHALMIC | Status: DC
  Filled 2012-11-03: qty 3

## 2012-11-03 MED ORDER — MIDAZOLAM HCL 2 MG/2ML IJ SOLN
INTRAMUSCULAR | Status: AC
  Filled 2012-11-03: qty 2

## 2012-11-03 MED ORDER — GATIFLOXACIN 0.5 % OP SOLN OPTIME - NO CHARGE
OPHTHALMIC | Status: DC | PRN
  Administered 2012-11-03: 1 [drp] via OPHTHALMIC

## 2012-11-03 SURGICAL SUPPLY — 27 items
CAPSULAR TENSION RING-AMO (OPHTHALMIC RELATED) IMPLANT
CLOTH BEACON ORANGE TIMEOUT ST (SAFETY) ×1 IMPLANT
GLOVE BIO SURGEON STRL SZ7.5 (GLOVE) IMPLANT
GLOVE BIOGEL M 6.5 STRL (GLOVE) IMPLANT
GLOVE BIOGEL PI IND STRL 6.5 (GLOVE) IMPLANT
GLOVE BIOGEL PI IND STRL 7.0 (GLOVE) IMPLANT
GLOVE BIOGEL PI INDICATOR 6.5 (GLOVE) ×1
GLOVE BIOGEL PI INDICATOR 7.0 (GLOVE)
GLOVE ECLIPSE 6.5 STRL STRAW (GLOVE) IMPLANT
GLOVE ECLIPSE 7.5 STRL STRAW (GLOVE) IMPLANT
GLOVE EXAM NITRILE LRG STRL (GLOVE) IMPLANT
GLOVE EXAM NITRILE MD LF STRL (GLOVE) ×1 IMPLANT
GLOVE SKINSENSE NS SZ6.5 (GLOVE)
GLOVE SKINSENSE NS SZ7.0 (GLOVE)
GLOVE SKINSENSE STRL SZ6.5 (GLOVE) IMPLANT
GLOVE SKINSENSE STRL SZ7.0 (GLOVE) IMPLANT
INST SET CATARACT ~~LOC~~ (KITS) ×2 IMPLANT
KIT VITRECTOMY (OPHTHALMIC RELATED) IMPLANT
PAD ARMBOARD 7.5X6 YLW CONV (MISCELLANEOUS) ×1 IMPLANT
PROC W NO LENS (INTRAOCULAR LENS)
PROC W SPEC LENS (INTRAOCULAR LENS)
PROCESS W NO LENS (INTRAOCULAR LENS) IMPLANT
PROCESS W SPEC LENS (INTRAOCULAR LENS) IMPLANT
RING MALYGIN (MISCELLANEOUS) IMPLANT
SIGHTPATH CAT PROC W REG LENS (Ophthalmic Related) ×2 IMPLANT
VISCOELASTIC ADDITIONAL (OPHTHALMIC RELATED) IMPLANT
WATER STERILE IRR 250ML POUR (IV SOLUTION) ×1 IMPLANT

## 2012-11-03 NOTE — H&P (Signed)
I have reviewed the pre printed H&P, the patient was re-examined, and I have identified no significant interval changes in the patient's medical condition.  There is no change in the plan of care since the history and physical of record. 

## 2012-11-03 NOTE — Anesthesia Preprocedure Evaluation (Addendum)
Anesthesia Evaluation  Patient identified by MRN, date of birth, ID band Patient awake    Reviewed: Allergy & Precautions, H&P , NPO status , Patient's Chart, lab work & pertinent test results  Airway Mallampati: II TM Distance: >3 FB     Dental  (+) Edentulous Lower   Pulmonary neg pulmonary ROS,  breath sounds clear to auscultation        Cardiovascular hypertension, Pt. on medications + dysrhythmias Rhythm:Regular     Neuro/Psych  Neuromuscular disease    GI/Hepatic GERD-  Medicated,  Endo/Other    Renal/GU      Musculoskeletal  (+) Fibromyalgia -  Abdominal   Peds  Hematology   Anesthesia Other Findings   Reproductive/Obstetrics                           Anesthesia Physical Anesthesia Plan  ASA: III  Anesthesia Plan: MAC   Post-op Pain Management:    Induction: Intravenous  Airway Management Planned: Nasal Cannula  Additional Equipment:   Intra-op Plan:   Post-operative Plan:   Informed Consent: I have reviewed the patients History and Physical, chart, labs and discussed the procedure including the risks, benefits and alternatives for the proposed anesthesia with the patient or authorized representative who has indicated his/her understanding and acceptance.     Plan Discussed with:   Anesthesia Plan Comments:         Anesthesia Quick Evaluation  

## 2012-11-03 NOTE — Brief Op Note (Signed)
11/03/2012  9:25 AM  PATIENT:  Candie Echevaria  79 y.o. female  PRE-OPERATIVE DIAGNOSIS:  nuclear cataract left eye  POST-OPERATIVE DIAGNOSIS:  nuclear cataract left eye  PROCEDURE:  Procedure(s): CATARACT EXTRACTION PHACO AND INTRAOCULAR LENS PLACEMENT (IOC)  SURGEON:  Surgeon(s): Susa Simmonds, MD  ASSISTANTS: Marya Landry, CST  ANESTHESIA STAFF: Anesthesiologist: Laurene Footman, MD CRNA: Moshe Salisbury, CRNA  ANESTHESIA:   topical and MAC  REQUESTED LENS POWER: 22.5  LENS IMPLANT INFORMATION:  [employer]con [id number] s/n [id number]  Exp 05/2017  CUMULATIVE DISSIPATED ENERGY:13.74  INDICATIONS:see office H&P  OP FINDINGS:dense NS  COMPLICATIONS:None  DICTATION #: see scanned op note  PLAN OF CARE: KPE w IOL OS  PATIENT DISPOSITION:  Short Stay

## 2012-11-03 NOTE — Anesthesia Postprocedure Evaluation (Signed)
  Anesthesia Post-op Note  Patient: Danielle Vasquez  Procedure(s) Performed: Procedure(s) with comments: CATARACT EXTRACTION PHACO AND INTRAOCULAR LENS PLACEMENT (IOC) (Left) - CDE:  13.74  Patient Location: PACU and Short Stay  Anesthesia Type:MAC  Level of Consciousness: awake, alert  and oriented  Airway and Oxygen Therapy: Patient Spontanous Breathing  Post-op Pain: none  Post-op Assessment: Post-op Vital signs reviewed, Patient's Cardiovascular Status Stable, Respiratory Function Stable, Patent Airway and No signs of Nausea or vomiting  Post-op Vital Signs: Reviewed and stable  Complications: No apparent anesthesia complications

## 2012-11-03 NOTE — Transfer of Care (Signed)
Immediate Anesthesia Transfer of Care Note  Patient: Danielle Vasquez  Procedure(s) Performed: Procedure(s) with comments: CATARACT EXTRACTION PHACO AND INTRAOCULAR LENS PLACEMENT (IOC) (Left) - CDE:  13.74  Patient Location: PACU and Short Stay  Anesthesia Type:MAC  Level of Consciousness: awake  Airway & Oxygen Therapy: Patient Spontanous Breathing  Post-op Assessment: Report given to PACU RN  Post vital signs: Reviewed  Complications: No apparent anesthesia complications

## 2012-11-03 NOTE — Op Note (Signed)
See scanned op note 

## 2012-11-06 ENCOUNTER — Encounter (HOSPITAL_COMMUNITY): Payer: Self-pay | Admitting: Ophthalmology

## 2013-02-20 DIAGNOSIS — E119 Type 2 diabetes mellitus without complications: Secondary | ICD-10-CM | POA: Diagnosis not present

## 2013-02-20 DIAGNOSIS — M1990 Unspecified osteoarthritis, unspecified site: Secondary | ICD-10-CM | POA: Diagnosis not present

## 2013-02-20 DIAGNOSIS — K219 Gastro-esophageal reflux disease without esophagitis: Secondary | ICD-10-CM | POA: Diagnosis not present

## 2013-02-20 DIAGNOSIS — E785 Hyperlipidemia, unspecified: Secondary | ICD-10-CM | POA: Diagnosis not present

## 2013-02-20 DIAGNOSIS — I10 Essential (primary) hypertension: Secondary | ICD-10-CM | POA: Diagnosis not present

## 2013-04-08 DIAGNOSIS — R21 Rash and other nonspecific skin eruption: Secondary | ICD-10-CM | POA: Diagnosis not present

## 2013-04-08 DIAGNOSIS — T7840XA Allergy, unspecified, initial encounter: Secondary | ICD-10-CM | POA: Diagnosis not present

## 2013-05-27 DIAGNOSIS — H43399 Other vitreous opacities, unspecified eye: Secondary | ICD-10-CM | POA: Diagnosis not present

## 2013-05-27 DIAGNOSIS — H4310 Vitreous hemorrhage, unspecified eye: Secondary | ICD-10-CM | POA: Diagnosis not present

## 2013-05-27 DIAGNOSIS — H35419 Lattice degeneration of retina, unspecified eye: Secondary | ICD-10-CM | POA: Diagnosis not present

## 2013-06-22 DIAGNOSIS — H4310 Vitreous hemorrhage, unspecified eye: Secondary | ICD-10-CM | POA: Diagnosis not present

## 2013-07-08 DIAGNOSIS — H4010X0 Unspecified open-angle glaucoma, stage unspecified: Secondary | ICD-10-CM | POA: Diagnosis not present

## 2013-07-08 DIAGNOSIS — Z961 Presence of intraocular lens: Secondary | ICD-10-CM | POA: Diagnosis not present

## 2013-08-20 DIAGNOSIS — E119 Type 2 diabetes mellitus without complications: Secondary | ICD-10-CM | POA: Diagnosis not present

## 2013-08-20 DIAGNOSIS — E785 Hyperlipidemia, unspecified: Secondary | ICD-10-CM | POA: Diagnosis not present

## 2013-08-20 DIAGNOSIS — R7309 Other abnormal glucose: Secondary | ICD-10-CM | POA: Diagnosis not present

## 2013-08-20 DIAGNOSIS — I10 Essential (primary) hypertension: Secondary | ICD-10-CM | POA: Diagnosis not present

## 2013-08-24 DIAGNOSIS — H4010X0 Unspecified open-angle glaucoma, stage unspecified: Secondary | ICD-10-CM | POA: Diagnosis not present

## 2013-08-31 DIAGNOSIS — H4010X0 Unspecified open-angle glaucoma, stage unspecified: Secondary | ICD-10-CM | POA: Diagnosis not present

## 2014-01-12 DIAGNOSIS — H4010X0 Unspecified open-angle glaucoma, stage unspecified: Secondary | ICD-10-CM | POA: Diagnosis not present

## 2014-01-12 DIAGNOSIS — Z961 Presence of intraocular lens: Secondary | ICD-10-CM | POA: Diagnosis not present

## 2014-02-18 DIAGNOSIS — I10 Essential (primary) hypertension: Secondary | ICD-10-CM | POA: Diagnosis not present

## 2014-02-18 DIAGNOSIS — R7309 Other abnormal glucose: Secondary | ICD-10-CM | POA: Diagnosis not present

## 2014-02-18 DIAGNOSIS — E785 Hyperlipidemia, unspecified: Secondary | ICD-10-CM | POA: Diagnosis not present

## 2014-02-22 DIAGNOSIS — I10 Essential (primary) hypertension: Secondary | ICD-10-CM | POA: Diagnosis not present

## 2014-02-22 DIAGNOSIS — E785 Hyperlipidemia, unspecified: Secondary | ICD-10-CM | POA: Diagnosis not present

## 2014-02-22 DIAGNOSIS — E119 Type 2 diabetes mellitus without complications: Secondary | ICD-10-CM | POA: Diagnosis not present

## 2014-04-05 DIAGNOSIS — R946 Abnormal results of thyroid function studies: Secondary | ICD-10-CM | POA: Diagnosis not present

## 2014-04-05 DIAGNOSIS — L821 Other seborrheic keratosis: Secondary | ICD-10-CM | POA: Diagnosis not present

## 2014-04-05 DIAGNOSIS — L259 Unspecified contact dermatitis, unspecified cause: Secondary | ICD-10-CM | POA: Diagnosis not present

## 2014-04-05 DIAGNOSIS — E785 Hyperlipidemia, unspecified: Secondary | ICD-10-CM | POA: Diagnosis not present

## 2014-07-06 DIAGNOSIS — I10 Essential (primary) hypertension: Secondary | ICD-10-CM | POA: Diagnosis not present

## 2014-07-06 DIAGNOSIS — E119 Type 2 diabetes mellitus without complications: Secondary | ICD-10-CM | POA: Diagnosis not present

## 2014-07-08 DIAGNOSIS — I10 Essential (primary) hypertension: Secondary | ICD-10-CM | POA: Diagnosis not present

## 2014-07-08 DIAGNOSIS — E782 Mixed hyperlipidemia: Secondary | ICD-10-CM | POA: Diagnosis not present

## 2014-07-08 DIAGNOSIS — E119 Type 2 diabetes mellitus without complications: Secondary | ICD-10-CM | POA: Diagnosis not present

## 2014-07-08 DIAGNOSIS — K219 Gastro-esophageal reflux disease without esophagitis: Secondary | ICD-10-CM | POA: Diagnosis not present

## 2014-09-21 DIAGNOSIS — H4011X1 Primary open-angle glaucoma, mild stage: Secondary | ICD-10-CM | POA: Diagnosis not present

## 2014-09-21 DIAGNOSIS — Z961 Presence of intraocular lens: Secondary | ICD-10-CM | POA: Diagnosis not present

## 2014-10-04 DIAGNOSIS — H4011X1 Primary open-angle glaucoma, mild stage: Secondary | ICD-10-CM | POA: Diagnosis not present

## 2014-10-11 DIAGNOSIS — R7301 Impaired fasting glucose: Secondary | ICD-10-CM | POA: Diagnosis not present

## 2014-10-11 DIAGNOSIS — Z6824 Body mass index (BMI) 24.0-24.9, adult: Secondary | ICD-10-CM | POA: Diagnosis not present

## 2014-10-11 DIAGNOSIS — K219 Gastro-esophageal reflux disease without esophagitis: Secondary | ICD-10-CM | POA: Diagnosis not present

## 2014-10-11 DIAGNOSIS — H4011X1 Primary open-angle glaucoma, mild stage: Secondary | ICD-10-CM | POA: Diagnosis not present

## 2014-10-11 DIAGNOSIS — L501 Idiopathic urticaria: Secondary | ICD-10-CM | POA: Diagnosis not present

## 2014-11-15 DIAGNOSIS — E119 Type 2 diabetes mellitus without complications: Secondary | ICD-10-CM | POA: Diagnosis not present

## 2014-11-15 DIAGNOSIS — E782 Mixed hyperlipidemia: Secondary | ICD-10-CM | POA: Diagnosis not present

## 2014-11-15 DIAGNOSIS — I10 Essential (primary) hypertension: Secondary | ICD-10-CM | POA: Diagnosis not present

## 2014-11-18 DIAGNOSIS — I10 Essential (primary) hypertension: Secondary | ICD-10-CM | POA: Diagnosis not present

## 2014-11-18 DIAGNOSIS — E119 Type 2 diabetes mellitus without complications: Secondary | ICD-10-CM | POA: Diagnosis not present

## 2014-11-18 DIAGNOSIS — K219 Gastro-esophageal reflux disease without esophagitis: Secondary | ICD-10-CM | POA: Diagnosis not present

## 2014-11-18 DIAGNOSIS — L509 Urticaria, unspecified: Secondary | ICD-10-CM | POA: Diagnosis not present

## 2014-11-18 DIAGNOSIS — E782 Mixed hyperlipidemia: Secondary | ICD-10-CM | POA: Diagnosis not present

## 2015-01-25 ENCOUNTER — Encounter (INDEPENDENT_AMBULATORY_CARE_PROVIDER_SITE_OTHER): Payer: Self-pay | Admitting: *Deleted

## 2015-02-15 ENCOUNTER — Ambulatory Visit (INDEPENDENT_AMBULATORY_CARE_PROVIDER_SITE_OTHER): Payer: TRICARE For Life (TFL) | Admitting: Internal Medicine

## 2015-02-18 DIAGNOSIS — R946 Abnormal results of thyroid function studies: Secondary | ICD-10-CM | POA: Diagnosis not present

## 2015-02-18 DIAGNOSIS — I10 Essential (primary) hypertension: Secondary | ICD-10-CM | POA: Diagnosis not present

## 2015-02-18 DIAGNOSIS — E119 Type 2 diabetes mellitus without complications: Secondary | ICD-10-CM | POA: Diagnosis not present

## 2015-02-18 DIAGNOSIS — E782 Mixed hyperlipidemia: Secondary | ICD-10-CM | POA: Diagnosis not present

## 2015-02-22 DIAGNOSIS — K219 Gastro-esophageal reflux disease without esophagitis: Secondary | ICD-10-CM | POA: Diagnosis not present

## 2015-02-22 DIAGNOSIS — I10 Essential (primary) hypertension: Secondary | ICD-10-CM | POA: Diagnosis not present

## 2015-02-22 DIAGNOSIS — E119 Type 2 diabetes mellitus without complications: Secondary | ICD-10-CM | POA: Diagnosis not present

## 2015-02-22 DIAGNOSIS — E782 Mixed hyperlipidemia: Secondary | ICD-10-CM | POA: Diagnosis not present

## 2015-04-26 DIAGNOSIS — H538 Other visual disturbances: Secondary | ICD-10-CM | POA: Diagnosis not present

## 2015-04-26 DIAGNOSIS — Z961 Presence of intraocular lens: Secondary | ICD-10-CM | POA: Diagnosis not present

## 2015-04-26 DIAGNOSIS — H4011X1 Primary open-angle glaucoma, mild stage: Secondary | ICD-10-CM | POA: Diagnosis not present

## 2015-06-28 DIAGNOSIS — E119 Type 2 diabetes mellitus without complications: Secondary | ICD-10-CM | POA: Diagnosis not present

## 2015-06-30 DIAGNOSIS — I10 Essential (primary) hypertension: Secondary | ICD-10-CM | POA: Diagnosis not present

## 2015-06-30 DIAGNOSIS — E119 Type 2 diabetes mellitus without complications: Secondary | ICD-10-CM | POA: Diagnosis not present

## 2015-06-30 DIAGNOSIS — K219 Gastro-esophageal reflux disease without esophagitis: Secondary | ICD-10-CM | POA: Diagnosis not present

## 2015-06-30 DIAGNOSIS — R944 Abnormal results of kidney function studies: Secondary | ICD-10-CM | POA: Diagnosis not present

## 2015-06-30 DIAGNOSIS — Z23 Encounter for immunization: Secondary | ICD-10-CM | POA: Diagnosis not present

## 2015-06-30 DIAGNOSIS — E782 Mixed hyperlipidemia: Secondary | ICD-10-CM | POA: Diagnosis not present

## 2015-06-30 DIAGNOSIS — M542 Cervicalgia: Secondary | ICD-10-CM | POA: Diagnosis not present

## 2015-10-27 DIAGNOSIS — E119 Type 2 diabetes mellitus without complications: Secondary | ICD-10-CM | POA: Diagnosis not present

## 2015-11-01 DIAGNOSIS — R944 Abnormal results of kidney function studies: Secondary | ICD-10-CM | POA: Diagnosis not present

## 2015-11-01 DIAGNOSIS — E119 Type 2 diabetes mellitus without complications: Secondary | ICD-10-CM | POA: Diagnosis not present

## 2015-11-01 DIAGNOSIS — K219 Gastro-esophageal reflux disease without esophagitis: Secondary | ICD-10-CM | POA: Diagnosis not present

## 2015-11-01 DIAGNOSIS — I10 Essential (primary) hypertension: Secondary | ICD-10-CM | POA: Diagnosis not present

## 2015-11-01 DIAGNOSIS — M542 Cervicalgia: Secondary | ICD-10-CM | POA: Diagnosis not present

## 2015-11-01 DIAGNOSIS — J302 Other seasonal allergic rhinitis: Secondary | ICD-10-CM | POA: Diagnosis not present

## 2015-11-01 DIAGNOSIS — E782 Mixed hyperlipidemia: Secondary | ICD-10-CM | POA: Diagnosis not present

## 2015-11-02 DIAGNOSIS — Z961 Presence of intraocular lens: Secondary | ICD-10-CM | POA: Diagnosis not present

## 2015-11-02 DIAGNOSIS — H538 Other visual disturbances: Secondary | ICD-10-CM | POA: Diagnosis not present

## 2015-11-02 DIAGNOSIS — H401131 Primary open-angle glaucoma, bilateral, mild stage: Secondary | ICD-10-CM | POA: Diagnosis not present

## 2015-11-14 DIAGNOSIS — H401131 Primary open-angle glaucoma, bilateral, mild stage: Secondary | ICD-10-CM | POA: Diagnosis not present

## 2016-01-16 DIAGNOSIS — H401131 Primary open-angle glaucoma, bilateral, mild stage: Secondary | ICD-10-CM | POA: Diagnosis not present

## 2016-04-10 DIAGNOSIS — M9901 Segmental and somatic dysfunction of cervical region: Secondary | ICD-10-CM | POA: Diagnosis not present

## 2016-04-10 DIAGNOSIS — M47812 Spondylosis without myelopathy or radiculopathy, cervical region: Secondary | ICD-10-CM | POA: Diagnosis not present

## 2016-04-12 DIAGNOSIS — M47812 Spondylosis without myelopathy or radiculopathy, cervical region: Secondary | ICD-10-CM | POA: Diagnosis not present

## 2016-04-12 DIAGNOSIS — M9901 Segmental and somatic dysfunction of cervical region: Secondary | ICD-10-CM | POA: Diagnosis not present

## 2016-04-16 DIAGNOSIS — M9901 Segmental and somatic dysfunction of cervical region: Secondary | ICD-10-CM | POA: Diagnosis not present

## 2016-04-16 DIAGNOSIS — M47812 Spondylosis without myelopathy or radiculopathy, cervical region: Secondary | ICD-10-CM | POA: Diagnosis not present

## 2016-04-23 DIAGNOSIS — M47812 Spondylosis without myelopathy or radiculopathy, cervical region: Secondary | ICD-10-CM | POA: Diagnosis not present

## 2016-04-23 DIAGNOSIS — M9901 Segmental and somatic dysfunction of cervical region: Secondary | ICD-10-CM | POA: Diagnosis not present

## 2016-04-27 DIAGNOSIS — M47812 Spondylosis without myelopathy or radiculopathy, cervical region: Secondary | ICD-10-CM | POA: Diagnosis not present

## 2016-04-27 DIAGNOSIS — M9901 Segmental and somatic dysfunction of cervical region: Secondary | ICD-10-CM | POA: Diagnosis not present

## 2016-04-30 DIAGNOSIS — M9901 Segmental and somatic dysfunction of cervical region: Secondary | ICD-10-CM | POA: Diagnosis not present

## 2016-04-30 DIAGNOSIS — M47812 Spondylosis without myelopathy or radiculopathy, cervical region: Secondary | ICD-10-CM | POA: Diagnosis not present

## 2016-05-02 DIAGNOSIS — E119 Type 2 diabetes mellitus without complications: Secondary | ICD-10-CM | POA: Diagnosis not present

## 2016-05-02 DIAGNOSIS — E782 Mixed hyperlipidemia: Secondary | ICD-10-CM | POA: Diagnosis not present

## 2016-05-03 DIAGNOSIS — M47812 Spondylosis without myelopathy or radiculopathy, cervical region: Secondary | ICD-10-CM | POA: Diagnosis not present

## 2016-05-03 DIAGNOSIS — M9901 Segmental and somatic dysfunction of cervical region: Secondary | ICD-10-CM | POA: Diagnosis not present

## 2016-05-04 DIAGNOSIS — I10 Essential (primary) hypertension: Secondary | ICD-10-CM | POA: Diagnosis not present

## 2016-05-04 DIAGNOSIS — E119 Type 2 diabetes mellitus without complications: Secondary | ICD-10-CM | POA: Diagnosis not present

## 2016-05-04 DIAGNOSIS — R51 Headache: Secondary | ICD-10-CM | POA: Diagnosis not present

## 2016-05-04 DIAGNOSIS — J3089 Other allergic rhinitis: Secondary | ICD-10-CM | POA: Diagnosis not present

## 2016-05-04 DIAGNOSIS — M542 Cervicalgia: Secondary | ICD-10-CM | POA: Diagnosis not present

## 2016-05-04 DIAGNOSIS — K219 Gastro-esophageal reflux disease without esophagitis: Secondary | ICD-10-CM | POA: Diagnosis not present

## 2016-05-04 DIAGNOSIS — E782 Mixed hyperlipidemia: Secondary | ICD-10-CM | POA: Diagnosis not present

## 2016-05-04 DIAGNOSIS — R944 Abnormal results of kidney function studies: Secondary | ICD-10-CM | POA: Diagnosis not present

## 2016-05-10 DIAGNOSIS — M47812 Spondylosis without myelopathy or radiculopathy, cervical region: Secondary | ICD-10-CM | POA: Diagnosis not present

## 2016-05-10 DIAGNOSIS — M9901 Segmental and somatic dysfunction of cervical region: Secondary | ICD-10-CM | POA: Diagnosis not present

## 2016-05-17 DIAGNOSIS — H401131 Primary open-angle glaucoma, bilateral, mild stage: Secondary | ICD-10-CM | POA: Diagnosis not present

## 2016-05-17 DIAGNOSIS — M9901 Segmental and somatic dysfunction of cervical region: Secondary | ICD-10-CM | POA: Diagnosis not present

## 2016-05-17 DIAGNOSIS — M47812 Spondylosis without myelopathy or radiculopathy, cervical region: Secondary | ICD-10-CM | POA: Diagnosis not present

## 2016-05-17 DIAGNOSIS — H538 Other visual disturbances: Secondary | ICD-10-CM | POA: Diagnosis not present

## 2016-05-24 DIAGNOSIS — M47812 Spondylosis without myelopathy or radiculopathy, cervical region: Secondary | ICD-10-CM | POA: Diagnosis not present

## 2016-05-24 DIAGNOSIS — M9901 Segmental and somatic dysfunction of cervical region: Secondary | ICD-10-CM | POA: Diagnosis not present

## 2016-05-28 ENCOUNTER — Encounter (HOSPITAL_COMMUNITY)
Admission: RE | Disposition: A | Discharge status: Home / Self Care | Payer: Self-pay | Source: Ambulatory Visit | Attending: Ophthalmology

## 2016-05-28 ENCOUNTER — Ambulatory Visit (HOSPITAL_COMMUNITY)
Admission: RE | Admit: 2016-05-28 | Discharge: 2016-05-28 | Disposition: A | Discharge status: Home / Self Care | Payer: Medicare Other | Source: Ambulatory Visit | Attending: Ophthalmology | Admitting: Ophthalmology

## 2016-05-28 ENCOUNTER — Encounter (HOSPITAL_COMMUNITY): Payer: Self-pay

## 2016-05-28 DIAGNOSIS — I10 Essential (primary) hypertension: Secondary | ICD-10-CM | POA: Insufficient documentation

## 2016-05-28 DIAGNOSIS — K219 Gastro-esophageal reflux disease without esophagitis: Secondary | ICD-10-CM | POA: Insufficient documentation

## 2016-05-28 DIAGNOSIS — H2640 Unspecified secondary cataract: Secondary | ICD-10-CM | POA: Diagnosis not present

## 2016-05-28 DIAGNOSIS — H538 Other visual disturbances: Secondary | ICD-10-CM | POA: Diagnosis not present

## 2016-05-28 DIAGNOSIS — Z79899 Other long term (current) drug therapy: Secondary | ICD-10-CM | POA: Insufficient documentation

## 2016-05-28 DIAGNOSIS — H26491 Other secondary cataract, right eye: Secondary | ICD-10-CM | POA: Diagnosis not present

## 2016-05-28 DIAGNOSIS — M1990 Unspecified osteoarthritis, unspecified site: Secondary | ICD-10-CM | POA: Diagnosis not present

## 2016-05-28 DIAGNOSIS — E7800 Pure hypercholesterolemia, unspecified: Secondary | ICD-10-CM | POA: Diagnosis not present

## 2016-05-28 HISTORY — PX: YAG LASER APPLICATION: SHX6189

## 2016-05-28 SURGERY — TREATMENT, USING YAG LASER
Anesthesia: LOCAL | Laterality: Right

## 2016-05-28 MED ORDER — TETRACAINE HCL 0.5 % OP SOLN
1.0000 [drp] | Freq: Once | OPHTHALMIC | Status: AC
  Administered 2016-05-28: 1 [drp] via OPHTHALMIC

## 2016-05-28 MED ORDER — TROPICAMIDE 1 % OP SOLN
1.0000 [drp] | OPHTHALMIC | Status: AC
  Administered 2016-05-28 (×3): 1 [drp] via OPHTHALMIC

## 2016-05-28 MED ORDER — TETRACAINE HCL 0.5 % OP SOLN
OPHTHALMIC | Status: AC
  Filled 2016-05-28: qty 4

## 2016-05-28 MED ORDER — TROPICAMIDE 1 % OP SOLN
OPHTHALMIC | Status: AC
  Filled 2016-05-28: qty 3

## 2016-05-28 NOTE — Brief Op Note (Signed)
Danielle Vasquez 05/28/2016  [name], MD  Pre-op Diagnosis:  secondary cataract right eye  Post-op Diagnosis:  same  Yag laser self-test completed: Yes.    Indications:  See scanned office H&P for details  Procedure: YAG posterior capsulotomy  right eye  Eye protection worn by staff:  Yes.   Laser In Use sign on door:  Yes.    Laser:  {LUMENIS YAG/SLT LASER  Power Setting:  1.7 mJ/burst Anatomical site treated:  Posterior capsule OD Number of applications:  18 Total energy delivered: 30.6 mJ Results:  Open visual axis OD  Patient was instructed to go to the office, as previously scheduled, for intraocular pressure:  No.  Patient verbalizes understanding of discharge instructions:  Yes.    Notes:  Heavy elschnig's pearls OD  No complications, pt tolerated procedure well.

## 2016-05-28 NOTE — Discharge Instructions (Signed)
Danielle Vasquez  05/28/2016     Instructions    Activity: No Restrictions.   Diet: Resume Diet you were on at home.   Pain Medication: Tylenol if Needed.   CONTACT YOUR DOCTOR IF YOU HAVE PAIN, REDNESS IN YOUR EYE, OR DECREASED VISION.   Follow-up:in 3 weeks with [name], MD.   Dr. [name]: [phone]  Dr. Iona HansenJI:[id number]  Dr. Geoffry ParadiseID:[id number]   If you find that you cannot contact your physician, but feel that your signs and   Symptoms warrant a physician's attention, call the Emergency Room at   [phone] [phone].   Othern/a.  FOLLOW UP APPOINTMENT 06/19/2016 @ 2:45 PM WITH DR [name]

## 2016-05-28 NOTE — H&P (Signed)
I have reviewed the pre printed H&P, the patient was re-examined, and I have identified no significant interval changes in the patient's medical condition.  There is no change in the plan of care since the history and physical of record. 

## 2016-06-04 ENCOUNTER — Encounter (HOSPITAL_COMMUNITY): Payer: Self-pay | Admitting: Ophthalmology

## 2016-06-04 DIAGNOSIS — M47812 Spondylosis without myelopathy or radiculopathy, cervical region: Secondary | ICD-10-CM | POA: Diagnosis not present

## 2016-06-04 DIAGNOSIS — M9901 Segmental and somatic dysfunction of cervical region: Secondary | ICD-10-CM | POA: Diagnosis not present

## 2016-06-19 DIAGNOSIS — M9901 Segmental and somatic dysfunction of cervical region: Secondary | ICD-10-CM | POA: Diagnosis not present

## 2016-06-19 DIAGNOSIS — M47812 Spondylosis without myelopathy or radiculopathy, cervical region: Secondary | ICD-10-CM | POA: Diagnosis not present

## 2016-07-03 DIAGNOSIS — M9901 Segmental and somatic dysfunction of cervical region: Secondary | ICD-10-CM | POA: Diagnosis not present

## 2016-07-03 DIAGNOSIS — M47812 Spondylosis without myelopathy or radiculopathy, cervical region: Secondary | ICD-10-CM | POA: Diagnosis not present

## 2016-07-31 DIAGNOSIS — M9901 Segmental and somatic dysfunction of cervical region: Secondary | ICD-10-CM | POA: Diagnosis not present

## 2016-07-31 DIAGNOSIS — M47812 Spondylosis without myelopathy or radiculopathy, cervical region: Secondary | ICD-10-CM | POA: Diagnosis not present

## 2016-10-23 DIAGNOSIS — M47812 Spondylosis without myelopathy or radiculopathy, cervical region: Secondary | ICD-10-CM | POA: Diagnosis not present

## 2016-10-23 DIAGNOSIS — M9901 Segmental and somatic dysfunction of cervical region: Secondary | ICD-10-CM | POA: Diagnosis not present

## 2016-11-12 DIAGNOSIS — E119 Type 2 diabetes mellitus without complications: Secondary | ICD-10-CM | POA: Diagnosis not present

## 2016-11-12 DIAGNOSIS — I10 Essential (primary) hypertension: Secondary | ICD-10-CM | POA: Diagnosis not present

## 2016-11-15 DIAGNOSIS — R944 Abnormal results of kidney function studies: Secondary | ICD-10-CM | POA: Diagnosis not present

## 2016-11-15 DIAGNOSIS — L603 Nail dystrophy: Secondary | ICD-10-CM | POA: Diagnosis not present

## 2016-11-15 DIAGNOSIS — M542 Cervicalgia: Secondary | ICD-10-CM | POA: Diagnosis not present

## 2016-11-15 DIAGNOSIS — I10 Essential (primary) hypertension: Secondary | ICD-10-CM | POA: Diagnosis not present

## 2016-11-15 DIAGNOSIS — Z0000 Encounter for general adult medical examination without abnormal findings: Secondary | ICD-10-CM | POA: Diagnosis not present

## 2016-11-15 DIAGNOSIS — Z6820 Body mass index (BMI) 20.0-20.9, adult: Secondary | ICD-10-CM | POA: Diagnosis not present

## 2016-11-15 DIAGNOSIS — R51 Headache: Secondary | ICD-10-CM | POA: Diagnosis not present

## 2016-11-15 DIAGNOSIS — K219 Gastro-esophageal reflux disease without esophagitis: Secondary | ICD-10-CM | POA: Diagnosis not present

## 2016-11-15 DIAGNOSIS — E782 Mixed hyperlipidemia: Secondary | ICD-10-CM | POA: Diagnosis not present

## 2016-11-15 DIAGNOSIS — E119 Type 2 diabetes mellitus without complications: Secondary | ICD-10-CM | POA: Diagnosis not present

## 2017-02-05 DIAGNOSIS — I10 Essential (primary) hypertension: Secondary | ICD-10-CM | POA: Diagnosis not present

## 2017-02-05 DIAGNOSIS — R42 Dizziness and giddiness: Secondary | ICD-10-CM | POA: Diagnosis not present

## 2017-02-18 ENCOUNTER — Encounter: Payer: Self-pay | Admitting: Cardiovascular Disease

## 2017-02-22 ENCOUNTER — Encounter: Payer: Self-pay | Admitting: Internal Medicine

## 2017-03-14 DIAGNOSIS — H40003 Preglaucoma, unspecified, bilateral: Secondary | ICD-10-CM | POA: Diagnosis not present

## 2017-03-14 DIAGNOSIS — H40013 Open angle with borderline findings, low risk, bilateral: Secondary | ICD-10-CM | POA: Diagnosis not present

## 2017-03-20 ENCOUNTER — Encounter: Payer: Self-pay | Admitting: *Deleted

## 2017-03-22 ENCOUNTER — Ambulatory Visit (INDEPENDENT_AMBULATORY_CARE_PROVIDER_SITE_OTHER): Payer: Medicare Other | Admitting: Internal Medicine

## 2017-03-22 ENCOUNTER — Encounter: Payer: Self-pay | Admitting: Internal Medicine

## 2017-03-22 VITALS — BP 160/80 | HR 83 | Ht 60.0 in | Wt 108.0 lb

## 2017-03-22 DIAGNOSIS — R002 Palpitations: Secondary | ICD-10-CM | POA: Diagnosis not present

## 2017-03-22 DIAGNOSIS — I10 Essential (primary) hypertension: Secondary | ICD-10-CM

## 2017-03-22 DIAGNOSIS — E782 Mixed hyperlipidemia: Secondary | ICD-10-CM | POA: Diagnosis not present

## 2017-03-22 NOTE — Patient Instructions (Signed)
Your physician recommends that you schedule a follow-up appointment in: follow up with pcp     START Crestor 10 mg daily    No change in other medications      Thank you for choosing [facility] !

## 2017-03-22 NOTE — Progress Notes (Signed)
Cardiology Office Note   Date:  03/22/2017   ID:  Danielle Vasquez, DOB 10/13/1928, MRN [id number]  PCP:  Celene Squibb, MD  Cardiologist:   Dorris Carnes, MD   Pt referred by Dr Nevada Crane for HTN      History of Present Illness: Danielle Vasquez is a 83 y.o. female with a history of HTN  She follows by Dr hall.   She says her BP is up and down  ON one time was it extremely high (over 190)  She says she had one episode of palpitations  Says she was drinking a lot of caffeine that day  Stopped and she has had no further spells    Deneis SOB   NO dizziness       Current Meds  Medication Sig  . aspirin 81 MG tablet Take 81 mg by mouth daily.  . Cholecalciferol (VITAMIN D) 1000 UNITS capsule Take 1,000 Units by mouth daily.  Marland Kitchen lisinopril-hydrochlorothiazide (PRINZIDE,ZESTORETIC) 20-25 MG per tablet Take 2 tablets by mouth daily.  . metoprolol succinate (TOPROL-XL) 25 MG 24 hr tablet Take 25 mg by mouth daily.  . Omega-3 Fatty Acids (FISH OIL) 1200 MG CAPS Take 1,200 mg by mouth daily.   Marland Kitchen omeprazole (PRILOSEC) 20 MG capsule Take 20 mg by mouth daily.  . pravastatin (PRAVACHOL) 40 MG tablet Take 40 mg by mouth at bedtime.     Allergies:   Patient has no known allergies.   Past Medical History:  Diagnosis Date  . Dysrhythmia    fast heart beat"  . Fibromyalgia   . GERD (gastroesophageal reflux disease)   . Glaucoma   . HTN (hypertension)   . Hyperlipemia     Past Surgical History:  Procedure Laterality Date  . CATARACT EXTRACTION W/PHACO  09/01/2012   Procedure: CATARACT EXTRACTION PHACO AND INTRAOCULAR LENS PLACEMENT (IOC);  Surgeon: [name], MD;  Location: [facility];  Service: Ophthalmology;  Laterality: Right;  CDE=11.36  . CATARACT EXTRACTION W/PHACO Left 11/03/2012   Procedure: CATARACT EXTRACTION PHACO AND INTRAOCULAR LENS PLACEMENT (IOC);  Surgeon: [name], MD;  Location: [facility];  Service: Ophthalmology;  Laterality: Left;  CDE:  13.74  .  CHOLECYSTECTOMY    . DILATION AND CURETTAGE OF UTERUS    . YAG LASER APPLICATION Right 8/46/6599   Procedure: YAG LASER APPLICATION;  Surgeon: [name], MD;  Location: [facility];  Service: Ophthalmology;  Laterality: Right;     Social History:  The patient  reports that she has never smoked. She has never used smokeless tobacco. She reports that she does not drink alcohol or use drugs.   Family History:  The patient's family history includes Arthritis in her unknown relative; Heart disease in her unknown relative.    ROS:  Please see the history of present illness. All other systems are reviewed and  Negative to the above problem except as noted.    PHYSICAL EXAM: VS:  BP (!) 160/80 (BP Location: Left Arm)   Pulse 83   Ht 5' (1.524 m)   Wt 108 lb (49 kg)   SpO2 96%   BMI 21.09 kg/m   GEN: Well nourished, well developed, in no acute distress  HEENT: normal  Neck: no JVD, carotid bruits, or masses Cardiac: RRR; no murmurs, rubs, or gallops,no edema  Respiratory:  clear to auscultation bilaterally, normal work of breathing GI: soft, nontender, nondistended, + BS  No hepatomegaly  MS: no deformity Moving all extremities  Skin: warm and dry, no rash Neuro:  Strength and sensation are intact Psych: euthymic mood, full affect   EKG:  EKG is ordered today.  SR 75 bpm     Lipid Panel No results found for: CHOL, TRIG, HDL, CHOLHDL, VLDL, LDLCALC, LDLDIRECT    Wt Readings from Last 3 Encounters:  03/22/17 108 lb (49 kg)  10/30/12 130 lb (59 kg)  08/25/12 130 lb 12.8 oz (59.3 kg)      ASSESSMENT AND PLAN: 1  HTN  BP is a little high today  She says it can go to 110/  Given age, I am reluctant to add meds  May make her hypotensive at times    Follow closely  COnsider very low dose amlodipne if increases  2  HL  Pt with plaquing of carotids in 2006  I would not repeat USN but I would place on statin  Crestor 10 mg  LDL 1was 125  F/U lipids in 8 wks or later with Dr  [place] Crane  3  Palpitations  One spell  No recurrence  Follow  Will be available as needed.      Current medicines are reviewed at length with the patient today.  The patient does not have concerns regarding medicines.  Signed, Dorris Carnes, MD  03/22/2017 1:54 PM    [facility] [address], [place], [place]  [address] Phone: [phone]; Fax: [phone]

## 2017-03-28 ENCOUNTER — Telehealth: Payer: Self-pay | Admitting: Internal Medicine

## 2017-03-28 ENCOUNTER — Other Ambulatory Visit: Payer: Self-pay

## 2017-03-28 MED ORDER — ROSUVASTATIN CALCIUM 10 MG PO TABS: 10.0000 mg | ORAL_TABLET | Freq: Every day | ORAL | 3 refills | Status: DC

## 2017-03-28 NOTE — Telephone Encounter (Signed)
Sent RX in to [facility]. I called pt, but her phone cut off.

## 2017-03-28 NOTE — Telephone Encounter (Signed)
Per pt phone call--pt states she's supposed to be on Crestor per last visit w/ Dr. Harrington Challenger

## 2017-03-29 ENCOUNTER — Other Ambulatory Visit: Payer: Self-pay | Admitting: Adult Health

## 2017-03-29 MED ORDER — ROSUVASTATIN CALCIUM 10 MG PO TABS: 10.0000 mg | ORAL_TABLET | Freq: Every day | ORAL | 3 refills | Status: DC

## 2017-03-29 NOTE — Telephone Encounter (Signed)
Patient needs refill on Crestor sent to [facility] / tg

## 2017-03-29 NOTE — Telephone Encounter (Signed)
Sent to [facility]

## 2017-04-03 ENCOUNTER — Telehealth: Payer: Self-pay | Admitting: Internal Medicine

## 2017-04-03 NOTE — Telephone Encounter (Signed)
Spoke with patient.  She took Crestor 10 mg two evenings in a row.  Both nights woke from palpitations.  Did not take Crestor last night and has not felt any palpitations since.  Palpitations feel the same as that one occurrence she told Dr. Harrington Challenger about but there was no dizziness this time.  I advised to not take further Crestor at this time, but that Dr. Harrington Challenger may recommend decreasing dose/frequency.  Otherwise patient has appointment with Dr. Nevada Crane next month.  She is PRN follow up with cardiology.

## 2017-04-03 NOTE — Telephone Encounter (Signed)
New message   Pt is calling asking to be called back. She states that it is about her medication

## 2017-04-04 DIAGNOSIS — L570 Actinic keratosis: Secondary | ICD-10-CM | POA: Diagnosis not present

## 2017-04-04 DIAGNOSIS — X32XXXA Exposure to sunlight, initial encounter: Secondary | ICD-10-CM | POA: Diagnosis not present

## 2017-04-15 ENCOUNTER — Telehealth: Payer: Self-pay | Admitting: Internal Medicine

## 2017-04-15 NOTE — Telephone Encounter (Signed)
Per patient would like to speak with nurse regarding side effects of new medicaition . / tg

## 2017-04-16 NOTE — Telephone Encounter (Signed)
Returned pt call, advised her to hold the Crestor at this time. I see in chart she has already spoke with another nurse regarding the medication ad is awaiting Dr. Alan Ripper recommendations.

## 2017-04-21 ENCOUNTER — Emergency Department (HOSPITAL_COMMUNITY): Payer: Medicare Other

## 2017-04-21 ENCOUNTER — Emergency Department (HOSPITAL_COMMUNITY)
Admission: EM | Admit: 2017-04-21 | Discharge: 2017-04-21 | Disposition: A | Discharge status: Home / Self Care | Payer: Medicare Other | Attending: Emergency Medicine | Admitting: Emergency Medicine

## 2017-04-21 ENCOUNTER — Encounter (HOSPITAL_COMMUNITY): Payer: Self-pay | Admitting: *Deleted

## 2017-04-21 DIAGNOSIS — R42 Dizziness and giddiness: Secondary | ICD-10-CM | POA: Insufficient documentation

## 2017-04-21 DIAGNOSIS — R002 Palpitations: Secondary | ICD-10-CM

## 2017-04-21 DIAGNOSIS — Z7982 Long term (current) use of aspirin: Secondary | ICD-10-CM | POA: Diagnosis not present

## 2017-04-21 DIAGNOSIS — I4892 Unspecified atrial flutter: Secondary | ICD-10-CM

## 2017-04-21 DIAGNOSIS — Z79899 Other long term (current) drug therapy: Secondary | ICD-10-CM | POA: Insufficient documentation

## 2017-04-21 DIAGNOSIS — I10 Essential (primary) hypertension: Secondary | ICD-10-CM | POA: Diagnosis not present

## 2017-04-21 DIAGNOSIS — I4891 Unspecified atrial fibrillation: Secondary | ICD-10-CM | POA: Diagnosis not present

## 2017-04-21 LAB — URINALYSIS, ROUTINE W REFLEX MICROSCOPIC
BILIRUBIN URINE: NEGATIVE
GLUCOSE, UA: NEGATIVE mg/dL
Hgb urine dipstick: NEGATIVE
KETONES UR: NEGATIVE mg/dL
LEUKOCYTES UA: NEGATIVE
Nitrite: NEGATIVE
PH: 5 (ref 5.0–8.0)
Protein, ur: NEGATIVE mg/dL
Specific Gravity, Urine: 1.012 (ref 1.005–1.030)

## 2017-04-21 LAB — CBC WITH DIFFERENTIAL/PLATELET
BASOS ABS: 0 10*3/uL (ref 0.0–0.1)
Basophils Relative: 1 %
EOS ABS: 0.2 10*3/uL (ref 0.0–0.7)
EOS PCT: 3 %
HCT: 39.9 % (ref 36.0–46.0)
Hemoglobin: 13.6 g/dL (ref 12.0–15.0)
Lymphocytes Relative: 22 %
Lymphs Abs: 1.3 10*3/uL (ref 0.7–4.0)
MCH: 31.8 pg (ref 26.0–34.0)
MCHC: 34.1 g/dL (ref 30.0–36.0)
MCV: 93.2 fL (ref 78.0–100.0)
MONO ABS: 0.6 10*3/uL (ref 0.1–1.0)
Monocytes Relative: 9 %
Neutro Abs: 3.9 10*3/uL (ref 1.7–7.7)
Neutrophils Relative %: 65 %
PLATELETS: 144 10*3/uL — AB (ref 150–400)
RBC: 4.28 MIL/uL (ref 3.87–5.11)
RDW: 13.1 % (ref 11.5–15.5)
WBC: 6 10*3/uL (ref 4.0–10.5)

## 2017-04-21 LAB — TSH: TSH: 6.846 u[IU]/mL — AB (ref 0.350–4.500)

## 2017-04-21 LAB — COMPREHENSIVE METABOLIC PANEL
ALT: 12 U/L — AB (ref 14–54)
AST: 19 U/L (ref 15–41)
Albumin: 3.8 g/dL (ref 3.5–5.0)
Alkaline Phosphatase: 75 U/L (ref 38–126)
Anion gap: 7 (ref 5–15)
BUN: 29 mg/dL — ABNORMAL HIGH (ref 6–20)
CHLORIDE: 107 mmol/L (ref 101–111)
CO2: 27 mmol/L (ref 22–32)
CREATININE: 0.77 mg/dL (ref 0.44–1.00)
Calcium: 9.5 mg/dL (ref 8.9–10.3)
GFR calc non Af Amer: >60 mL/min (ref >60)
Glucose, Bld: 124 mg/dL — ABNORMAL HIGH (ref 65–99)
POTASSIUM: 3.7 mmol/L (ref 3.5–5.1)
SODIUM: 141 mmol/L (ref 135–145)
Total Bilirubin: 0.6 mg/dL (ref 0.3–1.2)
Total Protein: 6.2 g/dL — ABNORMAL LOW (ref 6.5–8.1)

## 2017-04-21 LAB — BRAIN NATRIURETIC PEPTIDE: B Natriuretic Peptide: 140 pg/mL — ABNORMAL HIGH (ref 0.0–100.0)

## 2017-04-21 LAB — TROPONIN I: Troponin I: <0.03 ng/mL (ref <0.03)

## 2017-04-21 MED ORDER — APIXABAN 2.5 MG PO TABS: 2.5000 mg | ORAL_TABLET | Freq: Two times a day (BID) | ORAL | 0 refills | Status: DC

## 2017-04-21 MED ORDER — APIXABAN 2.5 MG PO TABS
2.5000 mg | ORAL_TABLET | Freq: Once | ORAL | Status: AC
  Administered 2017-04-21: 2.5 mg via ORAL
  Filled 2017-04-21 (×2): qty 1

## 2017-04-21 NOTE — ED Provider Notes (Signed)
[facility] DEPT Provider Note   CSN: [id number] Arrival date & time: 04/21/17  7408     History   Chief Complaint Chief Complaint  Patient presents with  . Palpitations    HPI Danielle Vasquez is a 83 y.o. female.  Patient presents with palpitations that woke her from sleep. States they lasted about 15-20 minutes but since resolved. She's had intermittent episodes of lightheadedness and palpitations for the past several months but not been diagnosed with any dysrhythmias. She has a history of hypertension and hyperlipidemia. He sees Dr. Harrington Challenger of cardiology. She was told that her blood pressure goes high when she stands up and there has been adjustments her blood pressure medication but not recently. She denies chest pain, shortness of breath, nausea or vomiting. She endorses some lightheadedness when she stands up. Good by mouth intake and urine output. No history of heart problems is never had a heart attack or stents in her heart. EMS found her to be in atrial flutter and she has since converted to sinus rhythm.   The history is provided by the patient and the EMS personnel.  Palpitations   Associated symptoms include weakness. Pertinent negatives include no chest pain, no abdominal pain, no nausea, no vomiting, no headaches, no dizziness, no cough and no shortness of breath.    Past Medical History:  Diagnosis Date  . Dysrhythmia    fast heart beat"  . Fibromyalgia   . GERD (gastroesophageal reflux disease)   . Glaucoma   . HTN (hypertension)   . Hyperlipemia     Patient Active Problem List   Diagnosis Date Noted  . Pes anserinus bursitis of both knees 01/16/2012    Past Surgical History:  Procedure Laterality Date  . CATARACT EXTRACTION W/PHACO  09/01/2012   Procedure: CATARACT EXTRACTION PHACO AND INTRAOCULAR LENS PLACEMENT (IOC);  Surgeon: [name], MD;  Location: [facility];  Service: Ophthalmology;  Laterality: Right;  CDE=11.36  . CATARACT EXTRACTION  W/PHACO Left 11/03/2012   Procedure: CATARACT EXTRACTION PHACO AND INTRAOCULAR LENS PLACEMENT (IOC);  Surgeon: [name], MD;  Location: [facility];  Service: Ophthalmology;  Laterality: Left;  CDE:  13.74  . CHOLECYSTECTOMY    . DILATION AND CURETTAGE OF UTERUS    . YAG LASER APPLICATION Right 1/44/8185   Procedure: YAG LASER APPLICATION;  Surgeon: [name], MD;  Location: [facility];  Service: Ophthalmology;  Laterality: Right;    OB History    No data available       Home Medications    Prior to Admission medications   Medication Sig Start Date End Date Taking? Authorizing Provider  aspirin 81 MG tablet Take 81 mg by mouth daily.    [provider]  Cholecalciferol (VITAMIN D) 1000 UNITS capsule Take 1,000 Units by mouth daily.    [provider]  lisinopril-hydrochlorothiazide (PRINZIDE,ZESTORETIC) 20-25 MG per tablet Take 2 tablets by mouth daily.    [provider]  metoprolol succinate (TOPROL-XL) 25 MG 24 hr tablet Take 25 mg by mouth daily.    [provider]  Omega-3 Fatty Acids (FISH OIL) 1200 MG CAPS Take 1,200 mg by mouth daily.     [provider]  omeprazole (PRILOSEC) 20 MG capsule Take 20 mg by mouth daily.    [provider]  pravastatin (PRAVACHOL) 40 MG tablet Take 40 mg by mouth at bedtime.    [provider]  rosuvastatin (CRESTOR) 10 MG tablet Take 1 tablet (10 mg total)  by mouth daily. 03/29/17 06/27/17  Fay Records, MD    Family History Family History  Problem Relation Age of Onset  . Heart disease Unknown   . Arthritis Unknown     Social History Social History  Substance Use Topics  . Smoking status: Never Smoker  . Smokeless tobacco: Never Used  . Alcohol use No     Allergies   Patient has no known allergies.   Review of Systems Review of Systems  Constitutional: Negative for activity change and appetite change.  HENT: Negative for congestion and rhinorrhea.     Respiratory: Negative for cough, chest tightness and shortness of breath.   Cardiovascular: Positive for palpitations. Negative for chest pain.  Gastrointestinal: Negative for abdominal pain, nausea and vomiting.  Genitourinary: Negative for dysuria and hematuria.  Musculoskeletal: Negative for arthralgias and myalgias.  Neurological: Positive for weakness and light-headedness. Negative for dizziness and headaches.   all other systems are negative except as noted in the HPI and PMH.     Physical Exam Updated Vital Signs BP (!) 183/75   Pulse 65   Temp 98.1 F (36.7 C) (Oral)   Resp 16   Ht 5\' 1"  (1.549 m)   Wt 49 kg (108 lb)   SpO2 97%   BMI 20.41 kg/m   Physical Exam  Constitutional: She is oriented to person, place, and time. She appears well-developed and well-nourished. No distress.  HENT:  Head: Normocephalic and atraumatic.  Mouth/Throat: Oropharynx is clear and moist. No oropharyngeal exudate.  Eyes: Pupils are equal, round, and reactive to light. Conjunctivae and EOM are normal.  Neck: Normal range of motion. Neck supple.  No meningismus.  Cardiovascular: Normal rate, normal heart sounds and intact distal pulses.   No murmur heard. Irregular rhythm Intact DP and PT pulses  Pulmonary/Chest: Effort normal and breath sounds normal. No respiratory distress.  Abdominal: Soft. There is no tenderness. There is no rebound and no guarding.  Musculoskeletal: Normal range of motion. She exhibits no edema or tenderness.  Neurological: She is alert and oriented to person, place, and time. No cranial nerve deficit. She exhibits normal muscle tone. Coordination normal.  No ataxia on finger to nose bilaterally. No pronator drift. 5/5 strength throughout. CN 2-12 intact.Equal grip strength. Sensation intact.   Skin: Skin is warm.  Psychiatric: She has a normal mood and affect. Her behavior is normal.  Nursing note and vitals reviewed.    ED Treatments / Results  Labs (all  labs ordered are listed, but only abnormal results are displayed) Labs Reviewed  CBC WITH DIFFERENTIAL/PLATELET - Abnormal; Notable for the following:       Result Value   Platelets 144 (*)    All other components within normal limits  COMPREHENSIVE METABOLIC PANEL - Abnormal; Notable for the following:    Glucose, Bld 124 (*)    BUN 29 (*)    Total Protein 6.2 (*)    ALT 12 (*)    All other components within normal limits  BRAIN NATRIURETIC PEPTIDE - Abnormal; Notable for the following:    B Natriuretic Peptide 140.0 (*)    All other components within normal limits  TROPONIN I  URINALYSIS, ROUTINE W REFLEX MICROSCOPIC  TSH    EKG  EKG Interpretation  Date/Time:  Sunday April 21 2017 05:49:07 EDT Ventricular Rate:  60 PR Interval:    QRS Duration: 97 QT Interval:  429 QTC Calculation: 429 R Axis:   -14 Text Interpretation:  Sinus rhythm Atrial  premature complex Low voltage, precordial leads RSR' in V1 or V2, probably normal variant No significant change was found Confirmed by Ezequiel Essex [phone]) on 04/21/2017 6:03:11 AM       Radiology Dg Chest 2 View  Result Date: 04/21/2017 CLINICAL DATA:  Acute onset of palpitations and dizziness. Initial encounter. EXAM: CHEST  2 VIEW COMPARISON:  Chest radiograph performed 02/20/2007 FINDINGS: The lungs are well-aerated. Minimal right midlung scarring is noted. Minimal calcification is noted at the lung apices. There is no evidence of focal opacification, pleural effusion or pneumothorax. The heart is normal in size; the mediastinal contour is within normal limits. No acute osseous abnormalities are seen. IMPRESSION: No acute cardiopulmonary process seen. Electronically Signed   By: Garald Balding M.D.   On: 04/21/2017 04:28    Procedures Procedures (including critical care time)  Medications Ordered in ED Medications - No data to display   Initial Impression / Assessment and Plan / ED Course  I have reviewed the triage  vital signs and the nursing notes.  Pertinent labs & imaging results that were available during my care of the patient were reviewed by me and considered in my medical decision making (see chart for details).     Palpitations with new-onset atrial flutter documented by EMS. Now sinus rhythm in the 60s with sinus arrhythmia. No chest pain or shortness of breath. Feels back to baseline. No dizziness or lightheadedness.  This patients CHA2DS2-VASc Score and unadjusted Ischemic Stroke Rate (% per year) is equal to 4.8 % stroke rate/year from a score of 4  Above score calculated as 1 point each if present [CHF, HTN, DM, Vascular=MI/PAD/Aortic Plaque, Age if 65-74, or Female] Above score calculated as 2 points each if present [Age > 75, or Stroke/TIA/TE]  Labs reassuring. CXR negative. Remains asymptomatic with sinus arrhythmias in 39s. Orthostatics negative.  D/w Dr. Raiford Simmonds of cardiology.  He agrees with anticoagulation.  Continue metoprolol as prescribed. Follow up with Dr. Harrington Challenger this week.   CRITICAL CARE Performed by: Ezequiel Essex Total critical care time: 32 minutes Critical care time was exclusive of separately billable procedures and treating other patients. Critical care was necessary to treat or prevent imminent or life-threatening deterioration. Critical care was time spent personally by me on the following activities: development of treatment plan with patient and/or surrogate as well as nursing, discussions with consultants, evaluation of patient's response to treatment, examination of patient, obtaining history from patient or surrogate, ordering and performing treatments and interventions, ordering and review of laboratory studies, ordering and review of radiographic studies, pulse oximetry and re-evaluation of patient's condition.  Final Clinical Impressions(s) / ED Diagnoses   Final diagnoses:  Atrial flutter, unspecified type [facility])  Palpitations    New Prescriptions New  Prescriptions   No medications on file     Ezequiel Essex, MD 04/21/17 [phone]

## 2017-04-21 NOTE — Discharge Instructions (Signed)
Stop taking aspirin. Take eliquis instead. This is a blood thinner. Use extra caution to avoid falling or injury while on this medication. If you hit your head, you must come to the ED to be evaluated. Follow up with Dr. Harrington Challenger. Return to the ED if you develop chest pain, shortness of breath, or any other concerns.

## 2017-04-21 NOTE — ED Triage Notes (Signed)
Pt states that she woke up tonight with palpitations, denies any sob, chest pain, does admit to dizziness, pt states that she has been having problems with palpitations for the past few months but has not been diagnosed,

## 2017-04-23 DIAGNOSIS — E782 Mixed hyperlipidemia: Secondary | ICD-10-CM | POA: Diagnosis not present

## 2017-04-23 DIAGNOSIS — R42 Dizziness and giddiness: Secondary | ICD-10-CM | POA: Diagnosis not present

## 2017-04-23 DIAGNOSIS — I4892 Unspecified atrial flutter: Secondary | ICD-10-CM | POA: Diagnosis not present

## 2017-04-26 ENCOUNTER — Ambulatory Visit: Payer: Medicare Other | Admitting: Internal Medicine

## 2017-04-28 NOTE — Progress Notes (Signed)
Cardiology Office Note   Date:  04/29/2017   ID:  Danielle Vasquez, DOB 19-Oct-1928, MRN [id number]  PCP:  Celene Squibb, MD  Cardiologist:   Dorris Carnes, MD   F/U of paroxysmal atrial flutter      History of Present Illness: Danielle Vasquez is a 83 y.o. female with a history of HTN I saw her in July 2018  Patient seen in ED on 8/20    Was at home   Heart fluttering  EMS called  Tele with atrial flutter  15 min  No dizziness  Breathing OK  Converted by time got to ED  Last night had L arm discomfort  Mild   Some gas  Not related to palpitations    Feels a little bit of fluttering every night  Doesn't bother      Current Meds  Medication Sig  . apixaban (ELIQUIS) 2.5 MG TABS tablet Take 1 tablet (2.5 mg total) by mouth 2 (two) times daily.  . Cholecalciferol (VITAMIN D) 1000 UNITS capsule Take 1,000 Units by mouth daily.  Marland Kitchen lisinopril-hydrochlorothiazide (PRINZIDE,ZESTORETIC) 20-25 MG per tablet Take 2 tablets by mouth daily.  . metoprolol succinate (TOPROL-XL) 25 MG 24 hr tablet Take 25 mg by mouth 2 (two) times daily.   . Omega-3 Fatty Acids (FISH OIL) 1200 MG CAPS Take 1,200 mg by mouth daily.   Marland Kitchen omeprazole (PRILOSEC) 20 MG capsule Take 20 mg by mouth daily.  . [DISCONTINUED] pravastatin (PRAVACHOL) 40 MG tablet Take 40 mg by mouth at bedtime.  . [DISCONTINUED] rosuvastatin (CRESTOR) 10 MG tablet Take 1 tablet (10 mg total) by mouth daily.     Allergies:   Patient has no known allergies.   Past Medical History:  Diagnosis Date  . Dysrhythmia    fast heart beat"  . Fibromyalgia   . GERD (gastroesophageal reflux disease)   . Glaucoma   . HTN (hypertension)   . Hyperlipemia     Past Surgical History:  Procedure Laterality Date  . CATARACT EXTRACTION W/PHACO  09/01/2012   Procedure: CATARACT EXTRACTION PHACO AND INTRAOCULAR LENS PLACEMENT (IOC);  Surgeon: [name], MD;  Location: [facility];  Service: Ophthalmology;  Laterality: Right;  CDE=11.36  .  CATARACT EXTRACTION W/PHACO Left 11/03/2012   Procedure: CATARACT EXTRACTION PHACO AND INTRAOCULAR LENS PLACEMENT (IOC);  Surgeon: [name], MD;  Location: [facility];  Service: Ophthalmology;  Laterality: Left;  CDE:  13.74  . CHOLECYSTECTOMY    . DILATION AND CURETTAGE OF UTERUS    . YAG LASER APPLICATION Right 01/18/6159   Procedure: YAG LASER APPLICATION;  Surgeon: [name], MD;  Location: [facility];  Service: Ophthalmology;  Laterality: Right;     Social History:  The patient  reports that she has never smoked. She has never used smokeless tobacco. She reports that she does not drink alcohol or use drugs.   Family History:  The patient's family history includes Arthritis in her unknown relative; Heart disease in her unknown relative.    ROS:  Please see the history of present illness. All other systems are reviewed and  Negative to the above problem except as noted.    PHYSICAL EXAM: VS:  BP 120/80   Pulse 81   Ht 5\' 1"  (1.549 m)   Wt 107 lb 8 oz (48.8 kg)   SpO2 93%   BMI 20.31 kg/m   GEN: Well nourished, well developed, in no acute distress  HEENT: normal  Neck: no JVD,  carotid bruits, or masses Cardiac: RRR; no murmurs, rubs, or gallops,no edema  Respiratory:  clear to auscultation bilaterally, normal work of breathing GI: soft, nontender, nondistended, + BS  No hepatomegaly  MS: no deformity Moving all extremities   Skin: warm and dry, no rash Neuro:  Strength and sensation are intact Psych: euthymic mood, full affect   EKG:  EKG is not ordered today.   Lipid Panel No results found for: CHOL, TRIG, HDL, CHOLHDL, VLDL, LDLCALC, LDLDIRECT    Wt Readings from Last 3 Encounters:  04/29/17 107 lb 8 oz (48.8 kg)  04/21/17 108 lb (49 kg)  03/22/17 108 lb (49 kg)      ASSESSMENT AND PLAN:  1  Atrial flutter/fib This is the first episode of documented arrhythmia  Now in SR clinically   Continue Eliquis   Check echo and event monior   Check T3 T4    2  HTN   Good control     F/U in December  Current medicines are reviewed at length with the patient today.  The patient does not have concerns regarding medicines.  Signed, Dorris Carnes, MD  04/29/2017 2:38 PM    [address], [place], [place]  [address] Phone: [phone]; Fax: [phone]

## 2017-04-29 ENCOUNTER — Encounter (INDEPENDENT_AMBULATORY_CARE_PROVIDER_SITE_OTHER): Payer: Self-pay

## 2017-04-29 ENCOUNTER — Encounter: Payer: Self-pay | Admitting: Internal Medicine

## 2017-04-29 ENCOUNTER — Ambulatory Visit (INDEPENDENT_AMBULATORY_CARE_PROVIDER_SITE_OTHER): Payer: Medicare Other | Admitting: Internal Medicine

## 2017-04-29 VITALS — BP 120/80 | HR 81 | Ht 61.0 in | Wt 107.5 lb

## 2017-04-29 DIAGNOSIS — I480 Paroxysmal atrial fibrillation: Secondary | ICD-10-CM | POA: Diagnosis not present

## 2017-04-29 DIAGNOSIS — I10 Essential (primary) hypertension: Secondary | ICD-10-CM

## 2017-04-29 DIAGNOSIS — I482 Chronic atrial fibrillation, unspecified: Secondary | ICD-10-CM

## 2017-04-29 NOTE — Patient Instructions (Signed)
Your physician recommends that you continue on your current medications as directed. Please refer to the Current Medication list given to you today.  Your physician recommends that you return for lab work today (free T3, free T4,   Your physician has recommended that you wear an event monitor. Event monitors are medical devices that record the heart's electrical activity. Doctors most often [place] these monitors to diagnose arrhythmias. Arrhythmias are problems with the speed or rhythm of the heartbeat. The monitor is a small, portable device. You can wear one while you do your normal daily activities. This is usually used to diagnose what is causing palpitations/syncope (passing out).  Your physician has requested that you have an echocardiogram. Echocardiography is a painless test that uses sound waves to create images of your heart. It provides your doctor with information about the size and shape of your heart and how well your heart's chambers and valves are working. This procedure takes approximately one hour. There are no restrictions for this procedure.  Your physician recommends that you schedule a follow-up appointment in: December, 2018 with Dr. Harrington Challenger.

## 2017-04-30 LAB — T3, FREE: T3, Free: 3 pg/mL (ref 2.0–4.4)

## 2017-04-30 LAB — T4, FREE: FREE T4: 0.97 ng/dL (ref 0.82–1.77)

## 2017-05-08 ENCOUNTER — Ambulatory Visit: Payer: Medicare Other | Admitting: Physician Assistant

## 2017-05-09 ENCOUNTER — Ambulatory Visit (INDEPENDENT_AMBULATORY_CARE_PROVIDER_SITE_OTHER): Payer: Medicare Other

## 2017-05-09 ENCOUNTER — Ambulatory Visit (HOSPITAL_COMMUNITY): Payer: Medicare Other | Attending: Cardiovascular Disease

## 2017-05-09 ENCOUNTER — Other Ambulatory Visit: Payer: Self-pay

## 2017-05-09 DIAGNOSIS — E785 Hyperlipidemia, unspecified: Secondary | ICD-10-CM | POA: Insufficient documentation

## 2017-05-09 DIAGNOSIS — I482 Chronic atrial fibrillation, unspecified: Secondary | ICD-10-CM

## 2017-05-09 DIAGNOSIS — I10 Essential (primary) hypertension: Secondary | ICD-10-CM | POA: Insufficient documentation

## 2017-05-09 DIAGNOSIS — Z8249 Family history of ischemic heart disease and other diseases of the circulatory system: Secondary | ICD-10-CM | POA: Insufficient documentation

## 2017-05-09 DIAGNOSIS — I340 Nonrheumatic mitral (valve) insufficiency: Secondary | ICD-10-CM | POA: Diagnosis not present

## 2017-05-17 DIAGNOSIS — I10 Essential (primary) hypertension: Secondary | ICD-10-CM | POA: Diagnosis not present

## 2017-05-17 DIAGNOSIS — E119 Type 2 diabetes mellitus without complications: Secondary | ICD-10-CM | POA: Diagnosis not present

## 2017-05-23 ENCOUNTER — Telehealth: Payer: Self-pay | Admitting: Internal Medicine

## 2017-05-23 NOTE — Telephone Encounter (Signed)
I spoke with patient and re-affirmed she should take 2.5 mg dose, I called the pharmacy and confirmed that her dose had been increased to 5 mg BID,and we were decreasing it back to 2.5 mg BID.I will attempt to reach Dr.Hall. I spoke with Caryl Pina and she will relay to Dr Nevada Crane

## 2017-05-23 NOTE — Telephone Encounter (Signed)
She should be on 2.5 bid with size and age

## 2017-05-23 NOTE — Telephone Encounter (Signed)
Pt states she got call from pharmacy telling her her Eliquis was increased by Dr.Hall, she doesn't know why it was increased from 2.5 mg to 5 mg.She is afraid to take 5 mg, she only took 2.5 mg today. She could not reach Dr Nevada Crane,  today    Pt is over 82 yrs and weight is is less than 130 lbs  (pt weight 107 lbs)  Please advise

## 2017-05-23 NOTE — Telephone Encounter (Signed)
Please give pt a call--lvm stating Dr. Nevada Crane changed her Eliquis from 2.5 to 5mg  and she's not sure if she should take it, she's unable to get in touch w/ anyone at his office. Please give her a call

## 2017-05-28 DIAGNOSIS — I4891 Unspecified atrial fibrillation: Secondary | ICD-10-CM | POA: Diagnosis not present

## 2017-05-28 DIAGNOSIS — K219 Gastro-esophageal reflux disease without esophagitis: Secondary | ICD-10-CM | POA: Diagnosis not present

## 2017-05-28 DIAGNOSIS — E782 Mixed hyperlipidemia: Secondary | ICD-10-CM | POA: Diagnosis not present

## 2017-05-28 DIAGNOSIS — I10 Essential (primary) hypertension: Secondary | ICD-10-CM | POA: Diagnosis not present

## 2017-05-28 DIAGNOSIS — E119 Type 2 diabetes mellitus without complications: Secondary | ICD-10-CM | POA: Diagnosis not present

## 2017-05-28 DIAGNOSIS — R944 Abnormal results of kidney function studies: Secondary | ICD-10-CM | POA: Diagnosis not present

## 2017-06-10 ENCOUNTER — Other Ambulatory Visit: Payer: Self-pay | Admitting: *Deleted

## 2017-06-10 MED ORDER — APIXABAN 2.5 MG PO TABS: 2.5000 mg | ORAL_TABLET | Freq: Two times a day (BID) | ORAL | 1 refills | Status: DC

## 2017-06-10 NOTE — Telephone Encounter (Signed)
Paper refill for Eliquis 2.5mg  received from [facility];pt is 83 yrs old, wt-48.8kg, Crea-0.77 on 04/21/17, last seen by Dr. Harrington Challenger on 04/29/17. Eliquis 2.5mg  is correct per dosing criteria. Will send to [facility] as requested.

## 2017-08-15 ENCOUNTER — Ambulatory Visit (INDEPENDENT_AMBULATORY_CARE_PROVIDER_SITE_OTHER): Payer: Medicare Other | Admitting: Internal Medicine

## 2017-08-15 ENCOUNTER — Encounter: Payer: Self-pay | Admitting: Internal Medicine

## 2017-08-15 VITALS — BP 136/82 | HR 53 | Ht 61.0 in | Wt 105.8 lb

## 2017-08-15 DIAGNOSIS — I480 Paroxysmal atrial fibrillation: Secondary | ICD-10-CM

## 2017-08-15 DIAGNOSIS — E785 Hyperlipidemia, unspecified: Secondary | ICD-10-CM | POA: Diagnosis not present

## 2017-08-15 DIAGNOSIS — I10 Essential (primary) hypertension: Secondary | ICD-10-CM

## 2017-08-15 NOTE — Patient Instructions (Signed)
Your physician recommends that you continue on your current medications as directed. Please refer to the Current Medication list given to you today.  Your physician recommends that you return for lab work today (cbc, bmet, lipids)  Your physician wants you to follow-up in: August, 2019 with Dr. Harrington Challenger.  You will receive a reminder letter in the mail two months in advance. If you don't receive a letter, please call our office to schedule the follow-up appointment.

## 2017-08-15 NOTE — Progress Notes (Signed)
Cardiology Office Note   Date:  08/15/2017   ID:  Danielle Vasquez, DOB [birth year], MRN [id number]  PCP:  Celene Squibb, MD  Cardiologist:   Dorris Carnes, MD   F/U of paroxysmal atrial flutter      History of Present Illness: Danielle Vasquez is a 83 y.o. female with a history of HTN and PAF    Seen in ED on 8/19 with atrial fib   Eliquis started  SInce seen denies palpitations   Breathing is good  No Cp  No dizziness  Having some reflux symtoms but if takes omeprazole daily has diarrhea  Current Meds  Medication Sig  . apixaban (ELIQUIS) 2.5 MG TABS tablet Take 1 tablet (2.5 mg total) by mouth 2 (two) times daily.  . Cholecalciferol (VITAMIN D) 1000 UNITS capsule Take 1,000 Units by mouth daily.  Marland Kitchen ezetimibe (ZETIA) 10 MG tablet Take 10 mg by mouth daily.  Marland Kitchen lisinopril (PRINIVIL,ZESTRIL) 20 MG tablet Take 20 mg by mouth daily.  . metoprolol succinate (TOPROL-XL) 25 MG 24 hr tablet Take 25 mg by mouth 2 (two) times daily.   . Omega-3 Fatty Acids (FISH OIL) 1200 MG CAPS Take 1,200 mg by mouth daily.   Marland Kitchen omeprazole (PRILOSEC) 20 MG capsule Take 20 mg by mouth daily.     Allergies:   Patient has no known allergies.   Past Medical History:  Diagnosis Date  . Dysrhythmia    fast heart beat"  . Fibromyalgia   . GERD (gastroesophageal reflux disease)   . Glaucoma   . HTN (hypertension)   . Hyperlipemia     Past Surgical History:  Procedure Laterality Date  . CATARACT EXTRACTION W/PHACO  09/01/2012   Procedure: CATARACT EXTRACTION PHACO AND INTRAOCULAR LENS PLACEMENT (IOC);  Surgeon: [name], MD;  Location: [facility];  Service: Ophthalmology;  Laterality: Right;  CDE=11.36  . CATARACT EXTRACTION W/PHACO Left 11/03/2012   Procedure: CATARACT EXTRACTION PHACO AND INTRAOCULAR LENS PLACEMENT (IOC);  Surgeon: [name], MD;  Location: [facility];  Service: Ophthalmology;  Laterality: Left;  CDE:  13.74  . CHOLECYSTECTOMY    . DILATION AND CURETTAGE OF UTERUS    .  YAG LASER APPLICATION Right 11/24/4008   Procedure: YAG LASER APPLICATION;  Surgeon: [name], MD;  Location: [facility];  Service: Ophthalmology;  Laterality: Right;     Social History:  The patient  reports that  has never smoked. she has never used smokeless tobacco. She reports that she does not drink alcohol or use drugs.   Family History:  The patient's family history includes Arthritis in her unknown relative; Heart disease in her unknown relative.    ROS:  Please see the history of present illness. All other systems are reviewed and  Negative to the above problem except as noted.    PHYSICAL EXAM: VS:  BP 136/82   Pulse (!) 53   Ht 5\' 1"  (1.549 m)   Wt 105 lb 12.8 oz (48 kg)   SpO2 96%   BMI 19.99 kg/m   GEN: Well nourished, well developed, in no acute distress  HEENT: normal  Neck: JVP normal  No, carotid bruits, or masses Cardiac: RRR; no murmurs, rubs, or gallops,no edema  Respiratory:  clear to auscultation bilaterally, normal work of breathing GI: soft, nontender, nondistended, + BS  No hepatomegaly  MS: no deformity Moving all extremities   Skin: warm and dry, no rash Neuro:  Strength and sensation are intact Psych:  euthymic mood, full affect   EKG:  EKG is not ordered today.   Lipid Panel No results found for: CHOL, TRIG, HDL, CHOLHDL, VLDL, LDLCALC, LDLDIRECT    Wt Readings from Last 3 Encounters:  08/15/17 105 lb 12.8 oz (48 kg)  04/29/17 107 lb 8 oz (48.8 kg)  04/21/17 108 lb (49 kg)      ASSESSMENT AND PLAN:  1  Atrial flutter/fib  Pt denies recurrencel   Continue Eliquis   Check CBC and BMET   2  HTN  aAdequate control  Check BMET  3  GI  Will try protonix and follow for diarrhea  4  HL  Check lipids     F/U in August   Current medicines are reviewed at length with the patient today.  The patient does not have concerns regarding medicines.  Signed, Dorris Carnes, MD  08/15/2017 9:50 AM    [address], [address], [place]  [address] Phone: [phone]; Fax: [phone]

## 2017-08-16 LAB — BASIC METABOLIC PANEL
BUN / CREAT RATIO: 30 — AB (ref 12–28)
BUN: 25 mg/dL (ref 8–27)
CHLORIDE: 103 mmol/L (ref 96–106)
CO2: 26 mmol/L (ref 20–29)
Calcium: 9.7 mg/dL (ref 8.7–10.3)
Creatinine, Ser: 0.82 mg/dL (ref 0.57–1.00)
GFR calc Af Amer: 75 mL/min/{1.73_m2} (ref >59)
GFR calc non Af Amer: 65 mL/min/{1.73_m2} (ref >59)
GLUCOSE: 95 mg/dL (ref 65–99)
POTASSIUM: 4.2 mmol/L (ref 3.5–5.2)
Sodium: 141 mmol/L (ref 134–144)

## 2017-08-16 LAB — LIPID PANEL
CHOLESTEROL TOTAL: 185 mg/dL (ref 100–199)
Chol/HDL Ratio: 3 ratio (ref 0.0–4.4)
HDL: 62 mg/dL (ref >39)
LDL Calculated: 104 mg/dL — ABNORMAL HIGH (ref 0–99)
Triglycerides: 93 mg/dL (ref 0–149)
VLDL CHOLESTEROL CAL: 19 mg/dL (ref 5–40)

## 2017-08-16 LAB — CBC
Hematocrit: 44.6 % (ref 34.0–46.6)
Hemoglobin: 15 g/dL (ref 11.1–15.9)
MCH: 31.6 pg (ref 26.6–33.0)
MCHC: 33.6 g/dL (ref 31.5–35.7)
MCV: 94 fL (ref 79–97)
PLATELETS: 175 10*3/uL (ref 150–379)
RBC: 4.75 x10E6/uL (ref 3.77–5.28)
RDW: 12.8 % (ref 12.3–15.4)
WBC: 7.9 10*3/uL (ref 3.4–10.8)

## 2017-09-02 ENCOUNTER — Telehealth: Payer: Self-pay

## 2017-09-02 NOTE — Telephone Encounter (Signed)
Pt called wanting a refill on her Metoprolol , however Dr.Ross has never prescribed this medication , do you want [place] to refill it ?

## 2017-09-05 MED ORDER — METOPROLOL SUCCINATE ER 25 MG PO TB24: 25.0000 mg | ORAL_TABLET | Freq: Two times a day (BID) | ORAL | 3 refills | Status: DC

## 2017-09-05 NOTE — Telephone Encounter (Signed)
Tried to reach patient to confirm which pharmacy she would like Toprol XL refilled to.  No answer, unable to leave message.  Last OV note states pt will change from omeprazole to protonix to see if helps her diarrhea.   Will review with Dr. Harrington Challenger.

## 2017-09-05 NOTE — Telephone Encounter (Signed)
Patient returned my call.   Continues to take Toprol XL BID as recommended by Dr. Harrington Challenger. Requests refills to [facility].

## 2017-09-06 ENCOUNTER — Telehealth: Payer: Self-pay | Admitting: Internal Medicine

## 2017-09-06 NOTE — Telephone Encounter (Signed)
[facility] requesting a clarification on metoprolol succinate and metoprolol tartrate. I clarified that pt is supposed to be taking metoprolol succinate 25 mg tablet BID, they vebalized understanding. Called pt to inform her per Dr. Harrington Challenger, Rodman Key, RN      2:28 PM  Note    Patient returned my call.   Continues to take Toprol XL BID as recommended by Dr. Harrington Challenger. Requests refills to [facility].      09/02/17 phone notes stated that pt is to take metoprolol succinate (Toprol) ER 25 mg tablet BID, pt was taking metoprolol tartrate 25 mg tablet that was prescribed by Dr. Nevada Crane. I informed pt that [facility] will be sending her out new medication concerning this. I advised the pt that if she has any other problems, questions or concerns to call the office. Pt verbalized understanding.

## 2017-09-30 DIAGNOSIS — E119 Type 2 diabetes mellitus without complications: Secondary | ICD-10-CM | POA: Diagnosis not present

## 2017-09-30 DIAGNOSIS — K219 Gastro-esophageal reflux disease without esophagitis: Secondary | ICD-10-CM | POA: Diagnosis not present

## 2017-09-30 DIAGNOSIS — E782 Mixed hyperlipidemia: Secondary | ICD-10-CM | POA: Diagnosis not present

## 2017-09-30 DIAGNOSIS — I10 Essential (primary) hypertension: Secondary | ICD-10-CM | POA: Diagnosis not present

## 2017-09-30 DIAGNOSIS — M1990 Unspecified osteoarthritis, unspecified site: Secondary | ICD-10-CM | POA: Diagnosis not present

## 2017-10-02 DIAGNOSIS — L501 Idiopathic urticaria: Secondary | ICD-10-CM | POA: Diagnosis not present

## 2017-10-02 DIAGNOSIS — M1990 Unspecified osteoarthritis, unspecified site: Secondary | ICD-10-CM | POA: Diagnosis not present

## 2017-10-02 DIAGNOSIS — E119 Type 2 diabetes mellitus without complications: Secondary | ICD-10-CM | POA: Diagnosis not present

## 2017-10-02 DIAGNOSIS — E782 Mixed hyperlipidemia: Secondary | ICD-10-CM | POA: Diagnosis not present

## 2017-10-02 DIAGNOSIS — I10 Essential (primary) hypertension: Secondary | ICD-10-CM | POA: Diagnosis not present

## 2017-10-02 DIAGNOSIS — R944 Abnormal results of kidney function studies: Secondary | ICD-10-CM | POA: Diagnosis not present

## 2017-10-02 DIAGNOSIS — K219 Gastro-esophageal reflux disease without esophagitis: Secondary | ICD-10-CM | POA: Diagnosis not present

## 2017-10-02 DIAGNOSIS — I4892 Unspecified atrial flutter: Secondary | ICD-10-CM | POA: Diagnosis not present

## 2017-11-19 ENCOUNTER — Other Ambulatory Visit: Payer: Self-pay | Admitting: Internal Medicine

## 2017-11-19 NOTE — Telephone Encounter (Signed)
Eliquis 2.5mg  refill request received; pt is 84 yrs old, Wt-48kg, Crea-0.82 on 08/15/17, last seen by Dr. Harrington Challenger on 08/15/17; will send in refill to requested pharmacy.

## 2018-01-14 DIAGNOSIS — E782 Mixed hyperlipidemia: Secondary | ICD-10-CM | POA: Diagnosis not present

## 2018-01-14 DIAGNOSIS — E119 Type 2 diabetes mellitus without complications: Secondary | ICD-10-CM | POA: Diagnosis not present

## 2018-01-16 DIAGNOSIS — I10 Essential (primary) hypertension: Secondary | ICD-10-CM | POA: Diagnosis not present

## 2018-01-16 DIAGNOSIS — E119 Type 2 diabetes mellitus without complications: Secondary | ICD-10-CM | POA: Diagnosis not present

## 2018-01-16 DIAGNOSIS — Z6822 Body mass index (BMI) 22.0-22.9, adult: Secondary | ICD-10-CM | POA: Diagnosis not present

## 2018-01-16 DIAGNOSIS — K219 Gastro-esophageal reflux disease without esophagitis: Secondary | ICD-10-CM | POA: Diagnosis not present

## 2018-01-16 DIAGNOSIS — E782 Mixed hyperlipidemia: Secondary | ICD-10-CM | POA: Diagnosis not present

## 2018-01-16 DIAGNOSIS — R944 Abnormal results of kidney function studies: Secondary | ICD-10-CM | POA: Diagnosis not present

## 2018-01-16 DIAGNOSIS — I4891 Unspecified atrial fibrillation: Secondary | ICD-10-CM | POA: Diagnosis not present

## 2018-02-13 DIAGNOSIS — Z6822 Body mass index (BMI) 22.0-22.9, adult: Secondary | ICD-10-CM | POA: Diagnosis not present

## 2018-02-13 DIAGNOSIS — K219 Gastro-esophageal reflux disease without esophagitis: Secondary | ICD-10-CM | POA: Diagnosis not present

## 2018-02-13 DIAGNOSIS — I10 Essential (primary) hypertension: Secondary | ICD-10-CM | POA: Diagnosis not present

## 2018-02-13 DIAGNOSIS — E782 Mixed hyperlipidemia: Secondary | ICD-10-CM | POA: Diagnosis not present

## 2018-02-13 DIAGNOSIS — I4891 Unspecified atrial fibrillation: Secondary | ICD-10-CM | POA: Diagnosis not present

## 2018-04-14 ENCOUNTER — Ambulatory Visit (INDEPENDENT_AMBULATORY_CARE_PROVIDER_SITE_OTHER): Payer: Medicare Other | Admitting: Internal Medicine

## 2018-04-14 ENCOUNTER — Encounter: Payer: Self-pay | Admitting: Internal Medicine

## 2018-04-14 VITALS — BP 158/82 | HR 56 | Ht 61.0 in | Wt 115.4 lb

## 2018-04-14 DIAGNOSIS — E782 Mixed hyperlipidemia: Secondary | ICD-10-CM

## 2018-04-14 DIAGNOSIS — I480 Paroxysmal atrial fibrillation: Secondary | ICD-10-CM

## 2018-04-14 DIAGNOSIS — I10 Essential (primary) hypertension: Secondary | ICD-10-CM | POA: Diagnosis not present

## 2018-04-14 LAB — BASIC METABOLIC PANEL
BUN/Creatinine Ratio: 25 (ref 12–28)
BUN: 22 mg/dL (ref 8–27)
CALCIUM: 9.9 mg/dL (ref 8.7–10.3)
CHLORIDE: 106 mmol/L (ref 96–106)
CO2: 24 mmol/L (ref 20–29)
Creatinine, Ser: 0.89 mg/dL (ref 0.57–1.00)
GFR calc non Af Amer: 58 mL/min/{1.73_m2} — ABNORMAL LOW (ref >59)
GFR, EST AFRICAN AMERICAN: 67 mL/min/{1.73_m2} (ref >59)
Glucose: 86 mg/dL (ref 65–99)
POTASSIUM: 4.5 mmol/L (ref 3.5–5.2)
Sodium: 142 mmol/L (ref 134–144)

## 2018-04-14 LAB — CBC
Hematocrit: 43.1 % (ref 34.0–46.6)
Hemoglobin: 14.3 g/dL (ref 11.1–15.9)
MCH: 31.2 pg (ref 26.6–33.0)
MCHC: 33.2 g/dL (ref 31.5–35.7)
MCV: 94 fL (ref 79–97)
PLATELETS: 174 10*3/uL (ref 150–450)
RBC: 4.59 x10E6/uL (ref 3.77–5.28)
RDW: 13.3 % (ref 12.3–15.4)
WBC: 7.3 10*3/uL (ref 3.4–10.8)

## 2018-04-14 NOTE — Progress Notes (Signed)
Cardiology Office Note   Date:  04/14/2018   ID:  Danielle Vasquez, DOB 1929-01-18, MRN [id number]  PCP:  Celene Squibb, MD  Cardiologist:   Dorris Carnes, MD   F/U of paroxysmal atrial flutter      History of Present Illness: Danielle Vasquez is a 84 y.o. female with a history of HTN and PAF     I saw her in December 2018    Pt says her breathing has been OK    Has occasional flutter  Lasts seconds  No dizziness Pt walks some   Goes  outside am or late PM    Current Meds  Medication Sig  . Cholecalciferol (VITAMIN D) 1000 UNITS capsule Take 1,000 Units by mouth daily.  Marland Kitchen ELIQUIS 2.5 MG TABS tablet TAKE 1 TABLET TWICE A DAY  . ezetimibe (ZETIA) 10 MG tablet Take 10 mg by mouth daily.  Marland Kitchen lisinopril (PRINIVIL,ZESTRIL) 20 MG tablet Take 20 mg by mouth daily.  . metoprolol succinate (TOPROL-XL) 25 MG 24 hr tablet Take 1 tablet (25 mg total) by mouth 2 (two) times daily.  . Omega-3 Fatty Acids (FISH OIL) 1200 MG CAPS Take 1,200 mg by mouth daily.   . pantoprazole (PROTONIX) 20 MG tablet Take 20 mg by mouth every morning.  . ranitidine (ZANTAC) 150 MG capsule Take 150 mg by mouth daily before supper.     Allergies:   Patient has no known allergies.   Past Medical History:  Diagnosis Date  . Dysrhythmia    fast heart beat"  . Fibromyalgia   . GERD (gastroesophageal reflux disease)   . Glaucoma   . HTN (hypertension)   . Hyperlipemia     Past Surgical History:  Procedure Laterality Date  . CATARACT EXTRACTION W/PHACO  09/01/2012   Procedure: CATARACT EXTRACTION PHACO AND INTRAOCULAR LENS PLACEMENT (IOC);  Surgeon: [name], MD;  Location: [facility];  Service: Ophthalmology;  Laterality: Right;  CDE=11.36  . CATARACT EXTRACTION W/PHACO Left 11/03/2012   Procedure: CATARACT EXTRACTION PHACO AND INTRAOCULAR LENS PLACEMENT (IOC);  Surgeon: [name], MD;  Location: [facility];  Service: Ophthalmology;  Laterality: Left;  CDE:  13.74  . CHOLECYSTECTOMY    . DILATION  AND CURETTAGE OF UTERUS    . YAG LASER APPLICATION Right 9/92/4268   Procedure: YAG LASER APPLICATION;  Surgeon: [name], MD;  Location: [facility];  Service: Ophthalmology;  Laterality: Right;     Social History:  The patient  reports that she has never smoked. She has never used smokeless tobacco. She reports that she does not drink alcohol or use drugs.   Family History:  The patient's family history includes Arthritis in her unknown relative; Heart disease in her unknown relative.    ROS:  Please see the history of present illness. All other systems are reviewed and  Negative to the above problem except as noted.    PHYSICAL EXAM: VS:  BP (!) 158/82 (BP Location: Left Arm, Patient Position: Sitting, Cuff Size: Normal)   Pulse (!) 56   Ht 5\' 1"  (1.549 m)   Wt 115 lb 6.4 oz (52.3 kg)   BMI 21.80 kg/m   GEN: Thin 87, in no acute distress  HEENT: normal  Neck: JVP is normal  No carotid bruits, or masses Cardiac: RRR; no murmurs, rubs, or gallops,no edema  Respiratory:  clear to auscultation bilaterally, normal work of breathing GI: soft, nontender, nondistended, + BS  No hepatomegaly  MS: no  deformity Moving all extremities   Skin: warm and dry, no rash Neuro:  Strength and sensation are intact Psych: euthymic mood, full affect   EKG:  EKG is ordered today.  SB 56      Lipid Panel    Component Value Date/Time   CHOL 185 08/15/2017 1023   TRIG 93 08/15/2017 1023   HDL 62 08/15/2017 1023   CHOLHDL 3.0 08/15/2017 1023   LDLCALC 104 (H) 08/15/2017 1023      Wt Readings from Last 3 Encounters:  04/14/18 115 lb 6.4 oz (52.3 kg)  08/15/17 105 lb 12.8 oz (48 kg)  04/29/17 107 lb 8 oz (48.8 kg)      ASSESSMENT AND PLAN:  1  Atrial flutter/fib     Remains in SR   No symptoms of palpitations  Check CBC and BMET   Keep on same meds    2  HTN  Pt has not followed BP often   BP cuff said not accurate with afib   I told her she is in SR most of time Will start  following at home   WIll call if BP above 150/   3  GI  REcomm GASX for gas  On protonic  4  HL Lipids OK in March   LDL 104   HDL 62    F/U in April  Current medicines are reviewed at length with the patient today.  The patient does not have concerns regarding medicines.  Signed, Dorris Carnes, MD  04/14/2018 9:39 AM    [facility] [address], [place], [place]  [address] Phone: [phone]; Fax: [phone]

## 2018-04-14 NOTE — Patient Instructions (Signed)
Your physician recommends that you continue on your current medications as directed. Please refer to the Current Medication list given to you today. Your physician recommends that you return for lab work now (bmet, [place])  Your physician wants you to follow-up in: April, 2020 with Dr. Harrington Challenger.  You will receive a reminder letter in the mail two months in advance. If you don't receive a letter, please call our office to schedule the follow-up appointment.  Try over the counter GasX for gas pains.

## 2018-04-21 ENCOUNTER — Telehealth: Payer: Self-pay | Admitting: Internal Medicine

## 2018-04-21 NOTE — Telephone Encounter (Signed)
Spoke with patient who wanted to confirm BPs are ok. She is feeling good. She thought she heard Dr. Harrington Challenger say her numbers should not be above 50.  Confirmed with pt to continue to monitor BP and call if SBP>150 or DBP>90 Pt verbalizes understanding and agreement with this plan.

## 2018-04-21 NOTE — Telephone Encounter (Signed)
New message:       Pt c/o BP issue: STAT if pt c/o blurred vision, one-sided weakness or slurred speech  1. What are your last 5 BP readings? 129/80, 134,72  2. Are you having any other symptoms (ex. Dizziness, headache, blurred vision, passed out)? No  3. What is your BP issue? Pt states this bp is not normal for her.

## 2018-05-28 DIAGNOSIS — Z9849 Cataract extraction status, unspecified eye: Secondary | ICD-10-CM | POA: Diagnosis not present

## 2018-05-28 DIAGNOSIS — H40013 Open angle with borderline findings, low risk, bilateral: Secondary | ICD-10-CM | POA: Diagnosis not present

## 2018-05-28 DIAGNOSIS — H40003 Preglaucoma, unspecified, bilateral: Secondary | ICD-10-CM | POA: Diagnosis not present

## 2018-05-28 DIAGNOSIS — Z961 Presence of intraocular lens: Secondary | ICD-10-CM | POA: Diagnosis not present

## 2018-07-16 DIAGNOSIS — E119 Type 2 diabetes mellitus without complications: Secondary | ICD-10-CM | POA: Diagnosis not present

## 2018-07-16 DIAGNOSIS — E782 Mixed hyperlipidemia: Secondary | ICD-10-CM | POA: Diagnosis not present

## 2018-07-16 DIAGNOSIS — I10 Essential (primary) hypertension: Secondary | ICD-10-CM | POA: Diagnosis not present

## 2018-07-18 ENCOUNTER — Other Ambulatory Visit: Payer: Self-pay

## 2018-07-21 DIAGNOSIS — R944 Abnormal results of kidney function studies: Secondary | ICD-10-CM | POA: Diagnosis not present

## 2018-07-21 DIAGNOSIS — R441 Visual hallucinations: Secondary | ICD-10-CM | POA: Diagnosis not present

## 2018-07-21 DIAGNOSIS — I480 Paroxysmal atrial fibrillation: Secondary | ICD-10-CM | POA: Diagnosis not present

## 2018-07-21 DIAGNOSIS — E1169 Type 2 diabetes mellitus with other specified complication: Secondary | ICD-10-CM | POA: Diagnosis not present

## 2018-07-21 DIAGNOSIS — Z0000 Encounter for general adult medical examination without abnormal findings: Secondary | ICD-10-CM | POA: Diagnosis not present

## 2018-07-21 DIAGNOSIS — I10 Essential (primary) hypertension: Secondary | ICD-10-CM | POA: Diagnosis not present

## 2018-07-21 DIAGNOSIS — E782 Mixed hyperlipidemia: Secondary | ICD-10-CM | POA: Diagnosis not present

## 2018-07-21 DIAGNOSIS — Z9181 History of falling: Secondary | ICD-10-CM | POA: Diagnosis not present

## 2018-07-21 DIAGNOSIS — M791 Myalgia, unspecified site: Secondary | ICD-10-CM | POA: Diagnosis not present

## 2018-07-21 DIAGNOSIS — K219 Gastro-esophageal reflux disease without esophagitis: Secondary | ICD-10-CM | POA: Diagnosis not present

## 2018-07-24 ENCOUNTER — Telehealth: Payer: Self-pay

## 2018-07-24 NOTE — Telephone Encounter (Signed)
Pt advised per Dr. Harrington Challenger, no med changes.

## 2018-07-24 NOTE — Telephone Encounter (Signed)
-----   Message from Fay Records, MD sent at 07/23/2018  5:47 PM EST ----- Cholesterol panel was better on last check  LDL 134   Was 104 Confirm taking Zetia Watch fats.

## 2018-07-24 NOTE — Telephone Encounter (Signed)
Advised pt her LDL had increased per Dr. Harrington Challenger.. She reports that she is taking her Zetia every day and has not had a change in her diet.. She tries to decrease her fat intake.. Advised her that I will forward to Dr. Harrington Challenger and will let her know if she would like to make any changes. Pt verbalized understanding.

## 2018-07-24 NOTE — Telephone Encounter (Signed)
No new recommendatonis Keep on same meds

## 2018-08-16 ENCOUNTER — Other Ambulatory Visit: Payer: Self-pay | Admitting: Internal Medicine

## 2018-08-18 NOTE — Telephone Encounter (Signed)
Eliquis 2.5mg  refill request received; pt is 84 yrs old, wt-52.3kg, Crea-0.89 on 04/14/18, last seen by Dr. Harrington Challenger on 04/14/18; will send in refill to requested pharmacy.

## 2018-10-13 ENCOUNTER — Other Ambulatory Visit: Payer: Self-pay | Admitting: Internal Medicine

## 2018-12-08 ENCOUNTER — Telehealth: Payer: Self-pay | Admitting: Internal Medicine

## 2018-12-08 NOTE — Telephone Encounter (Signed)
Informed pt that office will contact her about appt at the end of this month.  She understands office is working on re-arranging visit/re-scheduling and will call her once they are to the 27th.  Also advised to discuss her "floaters" with her PCP if they become worrisome/problematic.  Pt states that they look like little "squiggly lines" and understands they are not harmful.  Pt agreeable.

## 2018-12-08 NOTE — Telephone Encounter (Signed)
Pt called to ask about her appt at the end of the month. She doesn't want to be exposed to the virus, and does not want to leave the house.  She said she wanted Dr. Harrington Challenger to know she is having 'floaters (little black specks)' in her eyes. They do not bother her, but she does not expect them to be there. She says they have been coming and going to about three weeks now. That is the only issue she is having at the time.

## 2018-12-25 ENCOUNTER — Telehealth: Payer: Self-pay

## 2018-12-25 NOTE — Telephone Encounter (Signed)
LMTCB   Pt has OV on 4/27  Needs to be switched to Virtual 10:30am 4/27 if pt is agreeable.

## 2018-12-29 ENCOUNTER — Encounter: Payer: Self-pay | Admitting: Internal Medicine

## 2018-12-29 ENCOUNTER — Telehealth: Payer: Self-pay | Admitting: Internal Medicine

## 2018-12-29 ENCOUNTER — Telehealth (INDEPENDENT_AMBULATORY_CARE_PROVIDER_SITE_OTHER): Payer: Medicare Other | Admitting: Internal Medicine

## 2018-12-29 ENCOUNTER — Ambulatory Visit: Payer: Medicare Other | Admitting: Internal Medicine

## 2018-12-29 ENCOUNTER — Other Ambulatory Visit: Payer: Self-pay

## 2018-12-29 VITALS — BP 122/86 | HR 81 | Ht 61.0 in | Wt 115.0 lb

## 2018-12-29 DIAGNOSIS — I480 Paroxysmal atrial fibrillation: Secondary | ICD-10-CM | POA: Diagnosis not present

## 2018-12-29 DIAGNOSIS — I10 Essential (primary) hypertension: Secondary | ICD-10-CM

## 2018-12-29 NOTE — Telephone Encounter (Signed)
Called and got patients verbal consent to telephone visit 12/29/2018     Virtual Visit Pre-Appointment Phone Call  "(Name), I am calling you today to discuss your upcoming appointment. We are currently trying to limit exposure to the virus that causes COVID-19 by seeing patients at home rather than in the office."  1. "What is the BEST phone number to call the day of the visit?" - include this in appointment notes  2. "Do you have or have access to (through a family member/friend) a smartphone with video capability that we can use for your visit?" a. If yes - list this number in appt notes as "cell" (if different from BEST phone #) and list the appointment type as a VIDEO visit in appointment notes b. If no - list the appointment type as a PHONE visit in appointment notes  3. Confirm consent - "In the setting of the current Covid19 crisis, you are scheduled for a (phone or video) visit with your provider on (date) at (time).  Just as we do with many in-office visits, in order for you to participate in this visit, we must obtain consent.  If you'd like, I can send this to your mychart (if signed up) or email for you to review.  Otherwise, I can obtain your verbal consent now.  All virtual visits are billed to your insurance company just like a normal visit would be.  By agreeing to a virtual visit, we'd like you to understand that the technology does not allow for your provider to perform an examination, and thus may limit your provider's ability to fully assess your condition. If your provider identifies any concerns that need to be evaluated in person, we will make arrangements to do so.  Finally, though the technology is pretty good, we cannot assure that it will always work on either your or our end, and in the setting of a video visit, we may have to convert it to a phone-only visit.  In either situation, we cannot ensure that we have a secure connection.  Are you willing to proceed?" STAFF: Did  the patient verbally acknowledge consent to telehealth visit? Document YES/NO here: YES  4. Advise patient to be prepared - "Two hours prior to your appointment, go ahead and check your blood pressure, pulse, oxygen saturation, and your weight (if you have the equipment to check those) and write them all down. When your visit starts, your provider will ask you for this information. If you have an Apple Watch or Kardia device, please plan to have heart rate information ready on the day of your appointment. Please have a pen and paper handy nearby the day of the visit as well."  5. Give patient instructions for MyChart download to smartphone OR Doximity/Doxy.me as below if video visit (depending on what platform provider is using)  6. Inform patient they will receive a phone call 15 minutes prior to their appointment time (may be from unknown caller ID) so they should be prepared to answer    TELEPHONE CALL NOTE  Danielle Vasquez has been deemed a candidate for a follow-up tele-health visit to limit community exposure during the Covid-19 pandemic. I spoke with the patient via phone to ensure availability of phone/video source, confirm preferred email & phone number, and discuss instructions and expectations.  I reminded Danielle Vasquez to be prepared with any vital sign and/or heart rhythm information that could potentially be obtained via home monitoring, at the time of her visit.  I reminded Danielle Vasquez to expect a phone call prior to her visit.  Danielle Vasquez, [place] 12/29/2018 10:42 AM   INSTRUCTIONS FOR DOWNLOADING THE MYCHART APP TO SMARTPHONE  - The patient must first make sure to have activated MyChart and know their login information - If Apple, go to [employer] and type in MyChart in the search bar and download the app. If Android, ask patient to go to [employer] and type in [place] in the search bar and download the app. The app is free but as with any other app downloads,  their phone may require them to verify saved payment information or Apple/Android password.  - The patient will need to then log into the app with their MyChart username and password, and select [facility] as their healthcare provider to link the account. When it is time for your visit, go to the MyChart app, find appointments, and click Begin Video Visit. Be sure to Select Allow for your device to access the Microphone and Camera for your visit. You will then be connected, and your provider will be with you shortly.  **If they have any issues connecting, or need assistance please contact MyChart service desk [phone])**  **If using a computer, in order to ensure the best quality for their visit they will need to use either of the following Internet Browsers: [employer], or Google Chrome**  IF USING DOXIMITY or DOXY.ME - The patient will receive a link just prior to their visit by text.     FULL LENGTH CONSENT FOR TELE-HEALTH VISIT   I hereby voluntarily request, consent and authorize [facility] and its employed or contracted physicians, physician assistants, nurse practitioners or other licensed health care professionals (the Practitioner), to provide me with telemedicine health care services (the "Services") as deemed necessary by the treating Practitioner. I acknowledge and consent to receive the Services by the Practitioner via telemedicine. I understand that the telemedicine visit will involve communicating with the Practitioner through live audiovisual communication technology and the disclosure of certain medical information by electronic transmission. I acknowledge that I have been given the opportunity to request an in-person assessment or other available alternative prior to the telemedicine visit and am voluntarily participating in the telemedicine visit.  I understand that I have the right to withhold or withdraw my consent to the use of telemedicine in the  course of my care at any time, without affecting my right to future care or treatment, and that the Practitioner or I may terminate the telemedicine visit at any time. I understand that I have the right to inspect all information obtained and/or recorded in the course of the telemedicine visit and may receive copies of available information for a reasonable fee.  I understand that some of the potential risks of receiving the Services via telemedicine include:  Marland Kitchen Delay or interruption in medical evaluation due to technological equipment failure or disruption; . Information transmitted may not be sufficient (e.g. poor resolution of images) to allow for appropriate medical decision making by the Practitioner; and/or  . In rare instances, security protocols could fail, causing a breach of personal health information.  Furthermore, I acknowledge that it is my responsibility to provide information about my medical history, conditions and care that is complete and accurate to the best of my ability. I acknowledge that Practitioner's advice, recommendations, and/or decision may be based on factors not within their control, such as incomplete or inaccurate data provided by me or distortions  of diagnostic images or specimens that may result from electronic transmissions. I understand that the practice of medicine is not an exact science and that Practitioner makes no warranties or guarantees regarding treatment outcomes. I acknowledge that I will receive a copy of this consent concurrently upon execution via email to the email address I last provided but may also request a printed copy by calling the office of [facility].    I understand that my insurance will be billed for this visit.   I have read or had this consent read to me. . I understand the contents of this consent, which adequately explains the benefits and risks of the Services being provided via telemedicine.  . I have been provided ample  opportunity to ask questions regarding this consent and the Services and have had my questions answered to my satisfaction. . I give my informed consent for the services to be provided through the use of telemedicine in my medical care  By participating in this telemedicine visit I agree to the above.

## 2018-12-29 NOTE — Telephone Encounter (Signed)
Called pt. Twice at both numbers to get consent No answer.

## 2018-12-29 NOTE — Progress Notes (Signed)
Virtual Visit via Telephone Note   This visit type was conducted due to national recommendations for restrictions regarding the COVID-19 Pandemic (e.g. social distancing) in an effort to limit this patient's exposure and mitigate transmission in our community.  Due to her co-morbid illnesses, this patient is at least at moderate risk for complications without adequate follow up.  This format is felt to be most appropriate for this patient at this time.  The patient did not have access to video technology/had technical difficulties with video requiring transitioning to audio format only (telephone).  All issues noted in this document were discussed and addressed.  No physical exam could be performed with this format.  Please refer to the patient's chart for her  consent to telehealth for [facility].   Evaluation Performed:  Follow-up visit  Date:  12/29/2018   ID:  Danielle Vasquez, DOB [birth year], MRN [id number]  Patient Location: Home Provider Location: Home  PCP:  Celene Squibb, MD  Cardiologist:  Harrington Challenger Electrophysiologist:  None   Chief Complaint: HTN  History of Present Illness:    Danielle Vasquez is a 85 y.o. female withhx of HTN and PAF   I saw her in Aug 2019   Labs in Nov 2019 CBC normal   Cr normal   LDL 134     Pt says she feels fine   No SOB   NO CP     The patient does not have symptoms concerning for COVID-19 infection (fever, chills, cough, or new shortness of breath).    Past Medical History:  Diagnosis Date  . Dysrhythmia    fast heart beat"  . Fibromyalgia   . GERD (gastroesophageal reflux disease)   . Glaucoma   . HTN (hypertension)   . Hyperlipemia    Past Surgical History:  Procedure Laterality Date  . CATARACT EXTRACTION W/PHACO  09/01/2012   Procedure: CATARACT EXTRACTION PHACO AND INTRAOCULAR LENS PLACEMENT (IOC);  Surgeon: [name], MD;  Location: [facility];  Service: Ophthalmology;  Laterality: Right;  CDE=11.36  . CATARACT EXTRACTION  W/PHACO Left 11/03/2012   Procedure: CATARACT EXTRACTION PHACO AND INTRAOCULAR LENS PLACEMENT (IOC);  Surgeon: [name], MD;  Location: [facility];  Service: Ophthalmology;  Laterality: Left;  CDE:  13.74  . CHOLECYSTECTOMY    . DILATION AND CURETTAGE OF UTERUS    . YAG LASER APPLICATION Right 09/11/3233   Procedure: YAG LASER APPLICATION;  Surgeon: [name], MD;  Location: [facility];  Service: Ophthalmology;  Laterality: Right;     Current Meds  Medication Sig  . Cholecalciferol (VITAMIN D) 1000 UNITS capsule Take 1,000 Units by mouth daily.  Marland Kitchen ELIQUIS 2.5 MG TABS tablet TAKE 1 TABLET TWICE A DAY  . ezetimibe (ZETIA) 10 MG tablet Take 10 mg by mouth daily.  Marland Kitchen lisinopril (PRINIVIL,ZESTRIL) 20 MG tablet Take 20 mg by mouth daily.  . Omega-3 Fatty Acids (FISH OIL) 1200 MG CAPS Take 1,200 mg by mouth daily.   . pantoprazole (PROTONIX) 20 MG tablet Take 20 mg by mouth every morning.  . TOPROL XL 25 MG 24 hr tablet TAKE 1 TABLET TWICE A DAY     Allergies:   Patient has no known allergies.   Social History   Tobacco Use  . Smoking status: Never Smoker  . Smokeless tobacco: Never Used  Substance Use Topics  . Alcohol use: No  . Drug use: No     Family Hx: The patient's family history includes Arthritis  in her unknown relative; Heart disease in her unknown relative.  ROS:   Please see the history of present illness.    All other systems reviewed and are negative.   Prior CV studies:   The following studies were reviewed today: No studies  Labs/Other Tests and Data Reviewed:    EKG:  No ECG reviewed.  Recent Labs: 04/14/2018: BUN 22; Creatinine, Ser 0.89; Hemoglobin 14.3; Platelets 174; Potassium 4.5; Sodium 142   Recent Lipid Panel Lab Results  Component Value Date/Time   CHOL 185 08/15/2017 10:23 AM   TRIG 93 08/15/2017 10:23 AM   HDL 62 08/15/2017 10:23 AM   CHOLHDL 3.0 08/15/2017 10:23 AM   LDLCALC 104 (H) 08/15/2017 10:23 AM    Wt Readings from Last 3  Encounters:  12/29/18 115 lb (52.2 kg)  04/14/18 115 lb 6.4 oz (52.3 kg)  08/15/17 105 lb 12.8 oz (48 kg)     Objective:    Vital Signs:  BP 122/86   Pulse 81   Ht 5\' 1"  (1.549 m)   Wt 115 lb (52.2 kg)   BMI 21.73 kg/m    VITAL SIGNS:  reviewed Exam not done as tele visit  ASSESSMENT & PLAN:    1. PAF  PT denies palpitations   COntinue meds  2  HTN  BP is OK   Keep on same meds   3  HCM  Discussed diet and lpidis    COVID-19 Education: The signs and symptoms of COVID-19 were discussed with the patient and how to seek care for testing (follow up with PCP or arrange E-visit).  The importance of social distancing was discussed today.  Time:   Today, I have spent 20 minutes with the patient with telehealth technology discussing the above problems.     Medication Adjustments/Labs and Tests Ordered: Current medicines are reviewed at length with the patient today.  Concerns regarding medicines are outlined above.   Tests Ordered: No orders of the defined types were placed in this encounter.   Medication Changes: No orders of the defined types were placed in this encounter.   Disposition:  Follow up F/U in October/november Signed, Dorris Carnes, MD  12/29/2018 10:58 AM    [place]

## 2019-01-08 DIAGNOSIS — R944 Abnormal results of kidney function studies: Secondary | ICD-10-CM | POA: Diagnosis not present

## 2019-01-08 DIAGNOSIS — I10 Essential (primary) hypertension: Secondary | ICD-10-CM | POA: Diagnosis not present

## 2019-01-08 DIAGNOSIS — E119 Type 2 diabetes mellitus without complications: Secondary | ICD-10-CM | POA: Diagnosis not present

## 2019-01-08 DIAGNOSIS — E782 Mixed hyperlipidemia: Secondary | ICD-10-CM | POA: Diagnosis not present

## 2019-01-12 DIAGNOSIS — Z0000 Encounter for general adult medical examination without abnormal findings: Secondary | ICD-10-CM | POA: Diagnosis not present

## 2019-01-14 DIAGNOSIS — K219 Gastro-esophageal reflux disease without esophagitis: Secondary | ICD-10-CM | POA: Diagnosis not present

## 2019-01-14 DIAGNOSIS — E1169 Type 2 diabetes mellitus with other specified complication: Secondary | ICD-10-CM | POA: Diagnosis not present

## 2019-01-14 DIAGNOSIS — I10 Essential (primary) hypertension: Secondary | ICD-10-CM | POA: Diagnosis not present

## 2019-01-14 DIAGNOSIS — I480 Paroxysmal atrial fibrillation: Secondary | ICD-10-CM | POA: Diagnosis not present

## 2019-01-14 DIAGNOSIS — E782 Mixed hyperlipidemia: Secondary | ICD-10-CM | POA: Diagnosis not present

## 2019-03-30 DIAGNOSIS — I480 Paroxysmal atrial fibrillation: Secondary | ICD-10-CM | POA: Diagnosis not present

## 2019-03-30 DIAGNOSIS — I10 Essential (primary) hypertension: Secondary | ICD-10-CM | POA: Diagnosis not present

## 2019-03-30 DIAGNOSIS — E1169 Type 2 diabetes mellitus with other specified complication: Secondary | ICD-10-CM | POA: Diagnosis not present

## 2019-03-30 DIAGNOSIS — E782 Mixed hyperlipidemia: Secondary | ICD-10-CM | POA: Diagnosis not present

## 2019-03-30 DIAGNOSIS — K219 Gastro-esophageal reflux disease without esophagitis: Secondary | ICD-10-CM | POA: Diagnosis not present

## 2019-05-04 DIAGNOSIS — I10 Essential (primary) hypertension: Secondary | ICD-10-CM | POA: Diagnosis not present

## 2019-05-04 DIAGNOSIS — K219 Gastro-esophageal reflux disease without esophagitis: Secondary | ICD-10-CM | POA: Diagnosis not present

## 2019-05-04 DIAGNOSIS — E1169 Type 2 diabetes mellitus with other specified complication: Secondary | ICD-10-CM | POA: Diagnosis not present

## 2019-05-04 DIAGNOSIS — I480 Paroxysmal atrial fibrillation: Secondary | ICD-10-CM | POA: Diagnosis not present

## 2019-05-04 DIAGNOSIS — E782 Mixed hyperlipidemia: Secondary | ICD-10-CM | POA: Diagnosis not present

## 2019-05-12 DIAGNOSIS — I480 Paroxysmal atrial fibrillation: Secondary | ICD-10-CM | POA: Diagnosis not present

## 2019-05-12 DIAGNOSIS — K219 Gastro-esophageal reflux disease without esophagitis: Secondary | ICD-10-CM | POA: Diagnosis not present

## 2019-05-12 DIAGNOSIS — I10 Essential (primary) hypertension: Secondary | ICD-10-CM | POA: Diagnosis not present

## 2019-05-12 DIAGNOSIS — E1169 Type 2 diabetes mellitus with other specified complication: Secondary | ICD-10-CM | POA: Diagnosis not present

## 2019-05-12 DIAGNOSIS — E782 Mixed hyperlipidemia: Secondary | ICD-10-CM | POA: Diagnosis not present

## 2019-05-15 ENCOUNTER — Other Ambulatory Visit: Payer: Self-pay | Admitting: Internal Medicine

## 2019-05-15 NOTE — Telephone Encounter (Signed)
Pt last saw Dr Harrington Challenger 12/29/18 telemedicine Covid-19, last labs 01/08/19 Creat 1.05, age 85, weight 52.2kg, based on specified criteria pt is on appropriate dosage of Eliquis 2.5mg  BID.  Will refill rx.

## 2019-05-18 DIAGNOSIS — E782 Mixed hyperlipidemia: Secondary | ICD-10-CM | POA: Diagnosis not present

## 2019-05-18 DIAGNOSIS — E1169 Type 2 diabetes mellitus with other specified complication: Secondary | ICD-10-CM | POA: Diagnosis not present

## 2019-05-18 DIAGNOSIS — I10 Essential (primary) hypertension: Secondary | ICD-10-CM | POA: Diagnosis not present

## 2019-05-25 DIAGNOSIS — E1169 Type 2 diabetes mellitus with other specified complication: Secondary | ICD-10-CM | POA: Diagnosis not present

## 2019-05-25 DIAGNOSIS — E782 Mixed hyperlipidemia: Secondary | ICD-10-CM | POA: Diagnosis not present

## 2019-05-25 DIAGNOSIS — I480 Paroxysmal atrial fibrillation: Secondary | ICD-10-CM | POA: Diagnosis not present

## 2019-05-25 DIAGNOSIS — K219 Gastro-esophageal reflux disease without esophagitis: Secondary | ICD-10-CM | POA: Diagnosis not present

## 2019-05-25 DIAGNOSIS — I10 Essential (primary) hypertension: Secondary | ICD-10-CM | POA: Diagnosis not present

## 2019-06-12 DIAGNOSIS — E782 Mixed hyperlipidemia: Secondary | ICD-10-CM | POA: Diagnosis not present

## 2019-06-12 DIAGNOSIS — E1169 Type 2 diabetes mellitus with other specified complication: Secondary | ICD-10-CM | POA: Diagnosis not present

## 2019-06-12 DIAGNOSIS — I480 Paroxysmal atrial fibrillation: Secondary | ICD-10-CM | POA: Diagnosis not present

## 2019-06-12 DIAGNOSIS — I10 Essential (primary) hypertension: Secondary | ICD-10-CM | POA: Diagnosis not present

## 2019-06-12 DIAGNOSIS — K219 Gastro-esophageal reflux disease without esophagitis: Secondary | ICD-10-CM | POA: Diagnosis not present

## 2019-09-15 DIAGNOSIS — Z23 Encounter for immunization: Secondary | ICD-10-CM | POA: Diagnosis not present

## 2019-09-17 DIAGNOSIS — E1169 Type 2 diabetes mellitus with other specified complication: Secondary | ICD-10-CM | POA: Diagnosis not present

## 2019-09-17 DIAGNOSIS — E119 Type 2 diabetes mellitus without complications: Secondary | ICD-10-CM | POA: Diagnosis not present

## 2019-09-17 DIAGNOSIS — E782 Mixed hyperlipidemia: Secondary | ICD-10-CM | POA: Diagnosis not present

## 2019-09-17 DIAGNOSIS — I10 Essential (primary) hypertension: Secondary | ICD-10-CM | POA: Diagnosis not present

## 2019-09-21 DIAGNOSIS — E1169 Type 2 diabetes mellitus with other specified complication: Secondary | ICD-10-CM | POA: Diagnosis not present

## 2019-09-21 DIAGNOSIS — I10 Essential (primary) hypertension: Secondary | ICD-10-CM | POA: Diagnosis not present

## 2019-09-21 DIAGNOSIS — I480 Paroxysmal atrial fibrillation: Secondary | ICD-10-CM | POA: Diagnosis not present

## 2019-09-21 DIAGNOSIS — M170 Bilateral primary osteoarthritis of knee: Secondary | ICD-10-CM | POA: Diagnosis not present

## 2019-09-21 DIAGNOSIS — M791 Myalgia, unspecified site: Secondary | ICD-10-CM | POA: Diagnosis not present

## 2019-09-21 DIAGNOSIS — R944 Abnormal results of kidney function studies: Secondary | ICD-10-CM | POA: Diagnosis not present

## 2019-09-21 DIAGNOSIS — E782 Mixed hyperlipidemia: Secondary | ICD-10-CM | POA: Diagnosis not present

## 2019-09-21 DIAGNOSIS — K219 Gastro-esophageal reflux disease without esophagitis: Secondary | ICD-10-CM | POA: Diagnosis not present

## 2019-10-02 DIAGNOSIS — M170 Bilateral primary osteoarthritis of knee: Secondary | ICD-10-CM | POA: Diagnosis not present

## 2019-10-02 DIAGNOSIS — M791 Myalgia, unspecified site: Secondary | ICD-10-CM | POA: Diagnosis not present

## 2019-10-02 DIAGNOSIS — E1169 Type 2 diabetes mellitus with other specified complication: Secondary | ICD-10-CM | POA: Diagnosis not present

## 2019-10-02 DIAGNOSIS — I480 Paroxysmal atrial fibrillation: Secondary | ICD-10-CM | POA: Diagnosis not present

## 2019-10-02 DIAGNOSIS — E782 Mixed hyperlipidemia: Secondary | ICD-10-CM | POA: Diagnosis not present

## 2019-10-02 DIAGNOSIS — R944 Abnormal results of kidney function studies: Secondary | ICD-10-CM | POA: Diagnosis not present

## 2019-10-02 DIAGNOSIS — I10 Essential (primary) hypertension: Secondary | ICD-10-CM | POA: Diagnosis not present

## 2019-10-02 DIAGNOSIS — K219 Gastro-esophageal reflux disease without esophagitis: Secondary | ICD-10-CM | POA: Diagnosis not present

## 2019-10-26 DIAGNOSIS — Z23 Encounter for immunization: Secondary | ICD-10-CM | POA: Diagnosis not present

## 2020-02-09 ENCOUNTER — Other Ambulatory Visit: Payer: Self-pay | Admitting: Internal Medicine

## 2020-02-09 ENCOUNTER — Telehealth: Payer: Self-pay | Admitting: Internal Medicine

## 2020-02-09 NOTE — Telephone Encounter (Signed)
Pt has been scheduled to see Dr Harrington Challenger 03/17/20.  Will refill rx to get pt to appt. Refilled x 1 90 day supply.

## 2020-02-09 NOTE — Telephone Encounter (Signed)
Spoke with pt who states she feels like she has indigestion and if she "could burp she would feel better"  Pt states she has reflux and takes Protonix for this.  Pt states she has also been waking around 2am and gets up and feels better.  Pt reports it is possible she has been eating later and going to bed.  She states she is taking all meds as prescribed.  Pt denies CP, SOB, or dizziness.  Pt has appointment with Dr Harrington Challenger scheduled for 03/17/2020.  Offered to schedule earlier appointment with PA or NP.  Pt declines.  Encouraged pt to contact her PCP re: increase in possible reflux symptoms.  Pt will go directly to ED if she develops CP, SOB or dizziness.  Pt verbalizes understanding and agrees with current plan.

## 2020-02-09 NOTE — Telephone Encounter (Signed)
Pt last saw Dr Harrington Challenger 12/29/18 telemedicine Covid-19, overdue for follow-up. Last labs 09/17/19 Creat 0.74, age 86, weight 52.2kg, based on specified criteria pt is on appropriate dosage of Eliquis 2.5mg  BID.  Will send msg to scheduling staff pt needs follow-up appt. Will await appt to refill to get pt to appt.

## 2020-02-09 NOTE — Telephone Encounter (Signed)
Patient states that there is a buzzing feeling in her chest. She denies palpitations, flutters, chest pain, SOB, dizziness and headaches. States that her BP and HR are good. The only thing she can think of is that it may be caused by indigestion. Patient scheduled with Dr. Harrington Challenger on 03/17/20 in [place] beings that Dr. Harrington Challenger is booked up at [facility] until August and patient was only wanting to see Dr. Harrington Challenger.

## 2020-02-17 DIAGNOSIS — K219 Gastro-esophageal reflux disease without esophagitis: Secondary | ICD-10-CM | POA: Diagnosis not present

## 2020-02-17 DIAGNOSIS — L501 Idiopathic urticaria: Secondary | ICD-10-CM | POA: Diagnosis not present

## 2020-02-17 DIAGNOSIS — R197 Diarrhea, unspecified: Secondary | ICD-10-CM | POA: Diagnosis not present

## 2020-02-17 DIAGNOSIS — E119 Type 2 diabetes mellitus without complications: Secondary | ICD-10-CM | POA: Diagnosis not present

## 2020-02-17 DIAGNOSIS — M791 Myalgia, unspecified site: Secondary | ICD-10-CM | POA: Diagnosis not present

## 2020-02-17 DIAGNOSIS — I10 Essential (primary) hypertension: Secondary | ICD-10-CM | POA: Diagnosis not present

## 2020-02-17 DIAGNOSIS — I4891 Unspecified atrial fibrillation: Secondary | ICD-10-CM | POA: Diagnosis not present

## 2020-02-17 DIAGNOSIS — E1169 Type 2 diabetes mellitus with other specified complication: Secondary | ICD-10-CM | POA: Diagnosis not present

## 2020-02-17 DIAGNOSIS — E782 Mixed hyperlipidemia: Secondary | ICD-10-CM | POA: Diagnosis not present

## 2020-02-17 DIAGNOSIS — R441 Visual hallucinations: Secondary | ICD-10-CM | POA: Diagnosis not present

## 2020-02-17 DIAGNOSIS — I480 Paroxysmal atrial fibrillation: Secondary | ICD-10-CM | POA: Diagnosis not present

## 2020-02-17 DIAGNOSIS — I4892 Unspecified atrial flutter: Secondary | ICD-10-CM | POA: Diagnosis not present

## 2020-03-17 ENCOUNTER — Ambulatory Visit (INDEPENDENT_AMBULATORY_CARE_PROVIDER_SITE_OTHER): Payer: Medicare Other | Admitting: Internal Medicine

## 2020-03-17 ENCOUNTER — Encounter: Payer: Self-pay | Admitting: Internal Medicine

## 2020-03-17 ENCOUNTER — Other Ambulatory Visit: Payer: Self-pay

## 2020-03-17 VITALS — BP 142/74 | HR 72 | Ht 62.0 in | Wt 115.0 lb

## 2020-03-17 DIAGNOSIS — I480 Paroxysmal atrial fibrillation: Secondary | ICD-10-CM | POA: Diagnosis not present

## 2020-03-17 DIAGNOSIS — I2510 Atherosclerotic heart disease of native coronary artery without angina pectoris: Secondary | ICD-10-CM | POA: Diagnosis not present

## 2020-03-17 DIAGNOSIS — E782 Mixed hyperlipidemia: Secondary | ICD-10-CM | POA: Diagnosis not present

## 2020-03-17 MED ORDER — ROSUVASTATIN CALCIUM 10 MG PO TABS: 10.0000 mg | ORAL_TABLET | Freq: Every day | ORAL | 2 refills | Status: DC

## 2020-03-17 NOTE — Progress Notes (Signed)
Cardiology Office Note   Date:  03/17/2020   ID:  Danielle Vasquez, DOB [birth year], MRN [id number]  PCP:  Celene Squibb, MD  Cardiologist:   Dorris Carnes, MD   Patient presents for f/u of HTN and PAF    History of Present Illness: Danielle Vasquez is a 86 y.o. female with a history of HTN and PAF   I last saw her as a televisit  In April 2020 (televisit)    Since seen the pt has done OK  She denies CP   No SOB  No palpitations   No dizziness   Current Meds  Medication Sig  . apixaban (ELIQUIS) 2.5 MG TABS tablet Take 1 tablet (2.5 mg total) by mouth 2 (two) times daily. Overdue for follow-up, MUST see MD for future refills.  . Cholecalciferol (VITAMIN D) 1000 UNITS capsule Take 1,000 Units by mouth daily.  Marland Kitchen ezetimibe (ZETIA) 10 MG tablet Take 10 mg by mouth daily.  Marland Kitchen lisinopril (PRINIVIL,ZESTRIL) 20 MG tablet Take 20 mg by mouth daily.  . Omega-3 Fatty Acids (FISH OIL) 1200 MG CAPS Take 1,200 mg by mouth daily.   . pantoprazole (PROTONIX) 20 MG tablet Take 20 mg by mouth every morning.  . TOPROL XL 25 MG 24 hr tablet TAKE 1 TABLET TWICE A DAY     Allergies:   Patient has no known allergies.   Past Medical History:  Diagnosis Date  . Dysrhythmia    fast heart beat"  . Fibromyalgia   . GERD (gastroesophageal reflux disease)   . Glaucoma   . HTN (hypertension)   . Hyperlipemia     Past Surgical History:  Procedure Laterality Date  . CATARACT EXTRACTION W/PHACO  09/01/2012   Procedure: CATARACT EXTRACTION PHACO AND INTRAOCULAR LENS PLACEMENT (IOC);  Surgeon: [name], MD;  Location: [facility];  Service: Ophthalmology;  Laterality: Right;  CDE=11.36  . CATARACT EXTRACTION W/PHACO Left 11/03/2012   Procedure: CATARACT EXTRACTION PHACO AND INTRAOCULAR LENS PLACEMENT (IOC);  Surgeon: [name], MD;  Location: [facility];  Service: Ophthalmology;  Laterality: Left;  CDE:  13.74  . CHOLECYSTECTOMY    . DILATION AND CURETTAGE OF UTERUS    . YAG LASER APPLICATION  Right 3/97/6734   Procedure: YAG LASER APPLICATION;  Surgeon: [name], MD;  Location: [facility];  Service: Ophthalmology;  Laterality: Right;     Social History:  The patient  reports that she has never smoked. She has never used smokeless tobacco. She reports that she does not drink alcohol and does not use drugs.   Family History:  The patient's family history includes Arthritis in her unknown relative; Heart disease in her unknown relative.    ROS:  Please see the history of present illness. All other systems are reviewed and  Negative to the above problem except as noted.    PHYSICAL EXAM: VS:  BP (!) 142/74   Pulse 72   Ht 5\' 2"  (1.575 m)   Wt 115 lb (52.2 kg)   BMI 21.03 kg/m   GEN: Well nourished, well developed, in no acute distress  HEENT: normal  Neck: no JVD, carotid bruits Cardiac: RRR; no murmurs; no LE edema  Respiratory:  clear to auscultation bilaterally, normal work of breathing GI: soft, nontender, nondistended, + BS  No hepatomegaly  MS: no deformity Moving all extremities   Skin: warm and dry, no rash Neuro:  Strength and sensation are intact Psych: euthymic mood, full affect  EKG:  EKG is ordered today.  SR 72 bpm   Occasional PAC     Lipid Panel    Component Value Date/Time   CHOL 185 08/15/2017 1023   TRIG 93 08/15/2017 1023   HDL 62 08/15/2017 1023   CHOLHDL 3.0 08/15/2017 1023   LDLCALC 104 (H) 08/15/2017 1023      Wt Readings from Last 3 Encounters:  03/17/20 115 lb (52.2 kg)  12/29/18 115 lb (52.2 kg)  04/14/18 115 lb 6.4 oz (52.3 kg)      ASSESSMENT AND PLAN:  1  PAF  Pt without signif symptoms   Continue meds   2  HTN    Fair control  Keep on same regimen   3  LIpids    LDL was over 150  Hx CV dz  Will continue Zetia  Add Crestor   F/U lipids in 8 wks with AST  4  CV dz   Recheck USN to confirm not critical  Hx of CV dz on USN in 2006    F.U in May 2022       Current medicines are reviewed at length with the  patient today.  The patient does not have concerns regarding medicines.  Signed, Dorris Carnes, MD  03/17/2020 1:35 PM    [facility] [place], [place], [place]  [address] Phone: [phone]; Fax: [phone]

## 2020-03-17 NOTE — Patient Instructions (Addendum)
Medication Instructions:    START CRESTOR 10 MG ONCE A DAY   *If you need a refill on your cardiac medications before your next appointment, please call your pharmacy*   Lab Work: RETURN 8 WEEKS FOR FASTING LIPID AND LIVER   If you have labs (blood work) drawn today and your tests are completely normal, you will receive your results only by: [place] MyChart Message (if you have MyChart) OR . A paper copy in the mail If you have any lab test that is abnormal or we need to change your treatment, we will call you to review the results.   Testing/Procedures: Your physician has requested that you have a carotid duplex. This test is an ultrasound of the carotid arteries in your neck. It looks at blood flow through these arteries that supply the brain with blood. Allow one hour for this exam. There are no restrictions or special instructions.   Follow-Up: At [facility], you and your health needs are our priority.  As part of our continuing mission to provide you with exceptional heart care, we have created designated Provider Care Teams.  These Care Teams include your primary Cardiologist (physician) and Advanced Practice Providers (APPs -  Physician Assistants and Nurse Practitioners) who all work together to provide you with the care you need, when you need it.  We recommend signing up for the patient portal called "MyChart".  Sign up information is provided on this After Visit Summary.  MyChart is used to connect with patients for Virtual Visits (Telemedicine).  Patients are able to view lab/test results, encounter notes, upcoming appointments, etc.  Non-urgent messages can be sent to your provider as well.   To learn more about what you can do with MyChart, go to [email or web address].    Your next appointment:   10 month(s)  The format for your next appointment:   In Person  Provider:   You may see  Dr. Harrington Challenger  or one of the following Advanced Practice Providers on your designated Care  Team:    Bernerd Pho, PA-C   Ermalinda Barrios, [place]     Other Instructions

## 2020-03-25 ENCOUNTER — Other Ambulatory Visit: Payer: Self-pay

## 2020-03-25 ENCOUNTER — Ambulatory Visit (HOSPITAL_COMMUNITY)
Admission: RE | Admit: 2020-03-25 | Discharge: 2020-03-25 | Disposition: A | Discharge status: Home / Self Care | Payer: Medicare Other | Source: Ambulatory Visit | Attending: Internal Medicine | Admitting: Internal Medicine

## 2020-03-25 DIAGNOSIS — I6523 Occlusion and stenosis of bilateral carotid arteries: Secondary | ICD-10-CM | POA: Diagnosis not present

## 2020-03-25 DIAGNOSIS — I2510 Atherosclerotic heart disease of native coronary artery without angina pectoris: Secondary | ICD-10-CM | POA: Diagnosis not present

## 2020-05-10 ENCOUNTER — Other Ambulatory Visit (HOSPITAL_COMMUNITY)
Admission: RE | Admit: 2020-05-10 | Discharge: 2020-05-10 | Disposition: A | Discharge status: Home / Self Care | Payer: Medicare Other | Source: Ambulatory Visit | Attending: Internal Medicine | Admitting: Internal Medicine

## 2020-05-10 DIAGNOSIS — E782 Mixed hyperlipidemia: Secondary | ICD-10-CM | POA: Insufficient documentation

## 2020-05-10 LAB — HEPATIC FUNCTION PANEL
ALT: 15 U/L (ref 0–44)
AST: 21 U/L (ref 15–41)
Albumin: 4.2 g/dL (ref 3.5–5.0)
Alkaline Phosphatase: 69 U/L (ref 38–126)
Bilirubin, Direct: 0.1 mg/dL (ref 0.0–0.2)
Indirect Bilirubin: 0.9 mg/dL (ref 0.3–0.9)
Total Bilirubin: 1 mg/dL (ref 0.3–1.2)
Total Protein: 7.1 g/dL (ref 6.5–8.1)

## 2020-05-10 LAB — LIPID PANEL
Cholesterol: 230 mg/dL — ABNORMAL HIGH (ref 0–200)
HDL: 53 mg/dL (ref >40)
LDL Cholesterol: 159 mg/dL — ABNORMAL HIGH (ref 0–99)
Total CHOL/HDL Ratio: 4.3 RATIO
Triglycerides: 91 mg/dL (ref <150)
VLDL: 18 mg/dL (ref 0–40)

## 2020-05-23 ENCOUNTER — Telehealth: Payer: Self-pay

## 2020-05-23 NOTE — Telephone Encounter (Signed)
The patient has been notified of the result and verbalized understanding.  All questions (if any) were answered. Wilma Flavin, RN 05/23/2020 10:34 AM

## 2020-05-23 NOTE — Telephone Encounter (Signed)
-----   Message from Fay Records, MD sent at 05/21/2020 10:22 PM EDT ----- Keep on same meds  No changes

## 2020-05-27 ENCOUNTER — Other Ambulatory Visit: Payer: Self-pay | Admitting: Internal Medicine

## 2020-05-30 NOTE — Telephone Encounter (Signed)
Pt's age 86, wt 52.2 kg, SCr 0.95, CrCl 33.08, last ov w/ PR 03/20/20.

## 2020-06-16 DIAGNOSIS — I10 Essential (primary) hypertension: Secondary | ICD-10-CM | POA: Diagnosis not present

## 2020-06-16 DIAGNOSIS — E119 Type 2 diabetes mellitus without complications: Secondary | ICD-10-CM | POA: Diagnosis not present

## 2020-06-16 DIAGNOSIS — R944 Abnormal results of kidney function studies: Secondary | ICD-10-CM | POA: Diagnosis not present

## 2020-06-16 DIAGNOSIS — Z6822 Body mass index (BMI) 22.0-22.9, adult: Secondary | ICD-10-CM | POA: Diagnosis not present

## 2020-06-16 DIAGNOSIS — E1169 Type 2 diabetes mellitus with other specified complication: Secondary | ICD-10-CM | POA: Diagnosis not present

## 2020-06-16 DIAGNOSIS — R197 Diarrhea, unspecified: Secondary | ICD-10-CM | POA: Diagnosis not present

## 2020-06-16 DIAGNOSIS — E782 Mixed hyperlipidemia: Secondary | ICD-10-CM | POA: Diagnosis not present

## 2020-06-16 DIAGNOSIS — I4892 Unspecified atrial flutter: Secondary | ICD-10-CM | POA: Diagnosis not present

## 2020-06-16 DIAGNOSIS — Z2821 Immunization not carried out because of patient refusal: Secondary | ICD-10-CM | POA: Diagnosis not present

## 2020-06-16 DIAGNOSIS — L501 Idiopathic urticaria: Secondary | ICD-10-CM | POA: Diagnosis not present

## 2020-06-16 DIAGNOSIS — K219 Gastro-esophageal reflux disease without esophagitis: Secondary | ICD-10-CM | POA: Diagnosis not present

## 2020-06-16 DIAGNOSIS — I4891 Unspecified atrial fibrillation: Secondary | ICD-10-CM | POA: Diagnosis not present

## 2020-06-20 DIAGNOSIS — Z2809 Immunization not carried out because of other contraindication: Secondary | ICD-10-CM | POA: Diagnosis not present

## 2020-06-20 DIAGNOSIS — M791 Myalgia, unspecified site: Secondary | ICD-10-CM | POA: Diagnosis not present

## 2020-06-20 DIAGNOSIS — E1169 Type 2 diabetes mellitus with other specified complication: Secondary | ICD-10-CM | POA: Diagnosis not present

## 2020-06-20 DIAGNOSIS — I10 Essential (primary) hypertension: Secondary | ICD-10-CM | POA: Diagnosis not present

## 2020-06-20 DIAGNOSIS — I480 Paroxysmal atrial fibrillation: Secondary | ICD-10-CM | POA: Diagnosis not present

## 2020-06-20 DIAGNOSIS — E782 Mixed hyperlipidemia: Secondary | ICD-10-CM | POA: Diagnosis not present

## 2020-06-20 DIAGNOSIS — R944 Abnormal results of kidney function studies: Secondary | ICD-10-CM | POA: Diagnosis not present

## 2020-06-20 DIAGNOSIS — M170 Bilateral primary osteoarthritis of knee: Secondary | ICD-10-CM | POA: Diagnosis not present

## 2020-06-20 DIAGNOSIS — K219 Gastro-esophageal reflux disease without esophagitis: Secondary | ICD-10-CM | POA: Diagnosis not present

## 2020-06-30 DIAGNOSIS — L570 Actinic keratosis: Secondary | ICD-10-CM | POA: Diagnosis not present

## 2020-06-30 DIAGNOSIS — C44319 Basal cell carcinoma of skin of other parts of face: Secondary | ICD-10-CM | POA: Diagnosis not present

## 2020-06-30 DIAGNOSIS — X32XXXA Exposure to sunlight, initial encounter: Secondary | ICD-10-CM | POA: Diagnosis not present

## 2020-06-30 DIAGNOSIS — C44391 Other specified malignant neoplasm of skin of nose: Secondary | ICD-10-CM | POA: Diagnosis not present

## 2020-08-04 DIAGNOSIS — Z08 Encounter for follow-up examination after completed treatment for malignant neoplasm: Secondary | ICD-10-CM | POA: Diagnosis not present

## 2020-08-04 DIAGNOSIS — Z85828 Personal history of other malignant neoplasm of skin: Secondary | ICD-10-CM | POA: Diagnosis not present

## 2020-08-19 DIAGNOSIS — E119 Type 2 diabetes mellitus without complications: Secondary | ICD-10-CM | POA: Diagnosis not present

## 2020-08-23 DIAGNOSIS — Z23 Encounter for immunization: Secondary | ICD-10-CM | POA: Diagnosis not present

## 2020-10-27 DIAGNOSIS — K219 Gastro-esophageal reflux disease without esophagitis: Secondary | ICD-10-CM | POA: Diagnosis not present

## 2020-10-27 DIAGNOSIS — E782 Mixed hyperlipidemia: Secondary | ICD-10-CM | POA: Diagnosis not present

## 2020-10-27 DIAGNOSIS — R944 Abnormal results of kidney function studies: Secondary | ICD-10-CM | POA: Diagnosis not present

## 2020-10-27 DIAGNOSIS — I4892 Unspecified atrial flutter: Secondary | ICD-10-CM | POA: Diagnosis not present

## 2020-10-27 DIAGNOSIS — E1169 Type 2 diabetes mellitus with other specified complication: Secondary | ICD-10-CM | POA: Diagnosis not present

## 2020-10-27 DIAGNOSIS — Z6822 Body mass index (BMI) 22.0-22.9, adult: Secondary | ICD-10-CM | POA: Diagnosis not present

## 2020-10-27 DIAGNOSIS — I10 Essential (primary) hypertension: Secondary | ICD-10-CM | POA: Diagnosis not present

## 2020-10-27 DIAGNOSIS — R197 Diarrhea, unspecified: Secondary | ICD-10-CM | POA: Diagnosis not present

## 2020-10-27 DIAGNOSIS — Z2821 Immunization not carried out because of patient refusal: Secondary | ICD-10-CM | POA: Diagnosis not present

## 2020-10-27 DIAGNOSIS — E119 Type 2 diabetes mellitus without complications: Secondary | ICD-10-CM | POA: Diagnosis not present

## 2020-10-27 DIAGNOSIS — L501 Idiopathic urticaria: Secondary | ICD-10-CM | POA: Diagnosis not present

## 2020-10-27 DIAGNOSIS — I4891 Unspecified atrial fibrillation: Secondary | ICD-10-CM | POA: Diagnosis not present

## 2020-11-01 DIAGNOSIS — E782 Mixed hyperlipidemia: Secondary | ICD-10-CM | POA: Diagnosis not present

## 2020-11-01 DIAGNOSIS — I10 Essential (primary) hypertension: Secondary | ICD-10-CM | POA: Diagnosis not present

## 2020-11-01 DIAGNOSIS — E1169 Type 2 diabetes mellitus with other specified complication: Secondary | ICD-10-CM | POA: Diagnosis not present

## 2020-11-01 DIAGNOSIS — M791 Myalgia, unspecified site: Secondary | ICD-10-CM | POA: Diagnosis not present

## 2020-11-01 DIAGNOSIS — K219 Gastro-esophageal reflux disease without esophagitis: Secondary | ICD-10-CM | POA: Diagnosis not present

## 2020-11-01 DIAGNOSIS — R944 Abnormal results of kidney function studies: Secondary | ICD-10-CM | POA: Diagnosis not present

## 2020-11-01 DIAGNOSIS — M170 Bilateral primary osteoarthritis of knee: Secondary | ICD-10-CM | POA: Diagnosis not present

## 2020-11-01 DIAGNOSIS — Z2809 Immunization not carried out because of other contraindication: Secondary | ICD-10-CM | POA: Diagnosis not present

## 2020-11-01 DIAGNOSIS — I480 Paroxysmal atrial fibrillation: Secondary | ICD-10-CM | POA: Diagnosis not present

## 2021-01-04 ENCOUNTER — Telehealth: Payer: Self-pay

## 2021-01-04 NOTE — Telephone Encounter (Signed)
-----   Message from Fay Records, MD sent at 01/03/2021  9:46 PM EDT ----- Pt just had lipids    LDL was 159   Is she taking Crestor 10 and Zetia 10 If she is then I would increase Crestor to 20 mg    F/U lipids later this summer

## 2021-01-04 NOTE — Telephone Encounter (Signed)
Pt stated that she was taking Zetia 10 mg tablets daily, but was not taking Crestor 10 mg tablets. She stated the Crestor made her feel weak and "shaky". Pt verbalized understanding of LDL result.

## 2021-01-09 ENCOUNTER — Telehealth: Payer: Self-pay | Admitting: *Deleted

## 2021-01-09 DIAGNOSIS — E782 Mixed hyperlipidemia: Secondary | ICD-10-CM

## 2021-01-09 MED ORDER — ATORVASTATIN CALCIUM 10 MG PO TABS
10.0000 mg | ORAL_TABLET | Freq: Every day | ORAL | 3 refills | Status: DC
Start: 2021-01-09 — End: 2022-12-13

## 2021-01-09 NOTE — Telephone Encounter (Signed)
-----   Message from Paula Ross V, MD sent at 01/03/2021  9:46 PM EDT ----- Pt just had lipids    LDL was 159   Is she taking Crestor 10 and Zetia 10 If she is then I would increase Crestor to 20 mg    F/U lipids later this summer  

## 2021-01-19 IMAGING — US US CAROTID DUPLEX BILAT
1 series · 13 of 24 positions shown · non-contrast
Comparison: None.

CLINICAL DATA: 89-year-old female with a history coronary artery
disease

EXAM:
BILATERAL CAROTID DUPLEX ULTRASOUND
TECHNIQUE: Gray scale imaging, color Doppler and duplex ultrasound were
performed of bilateral carotid and vertebral arteries in the neck.

[Series 1: us carotid bilateral · 13 of 68 slices shown]
[im 1/68]
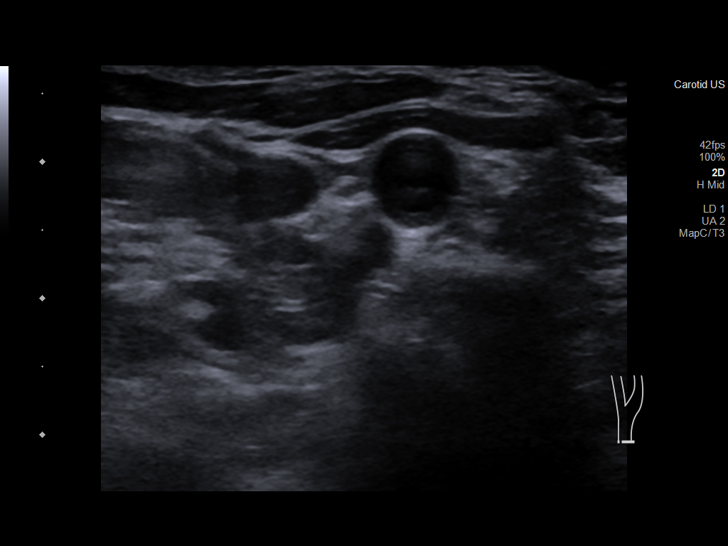
[im 6/68]
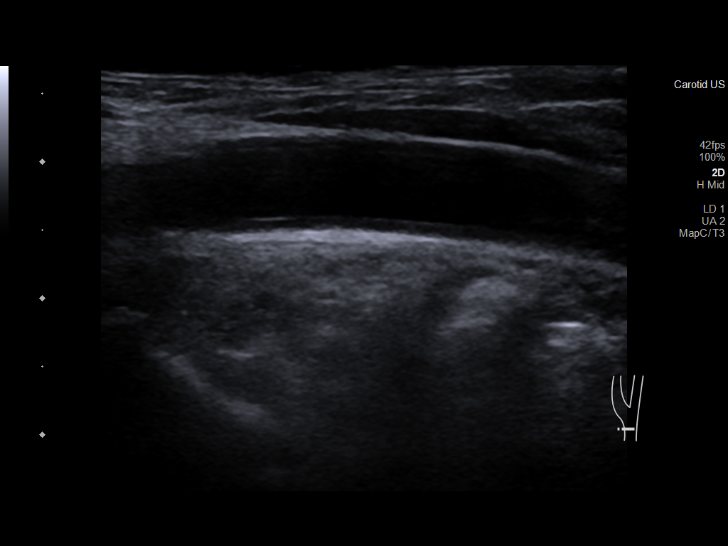
[im 12/68]
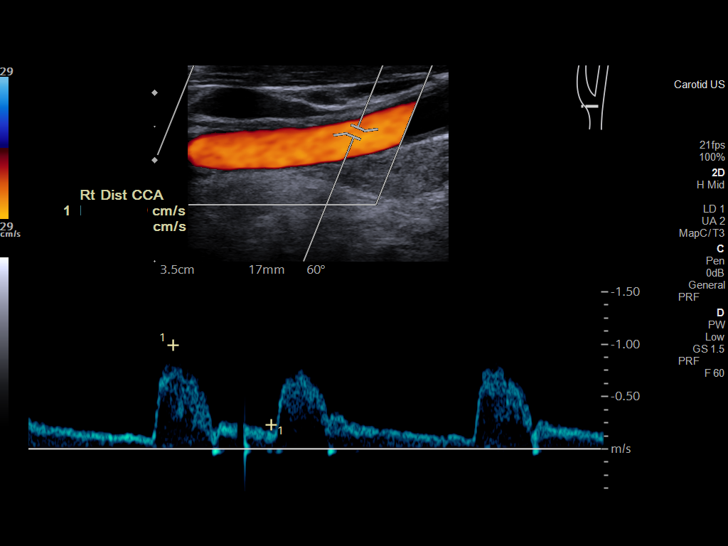
[im 18/68]
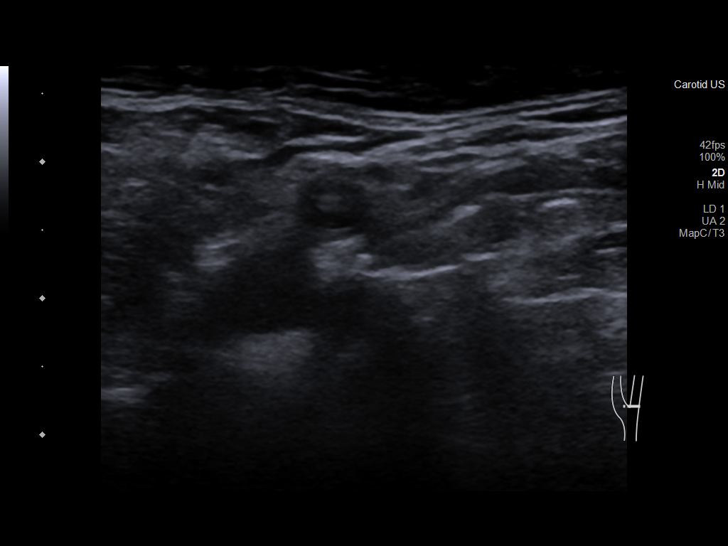
[im 24/68]
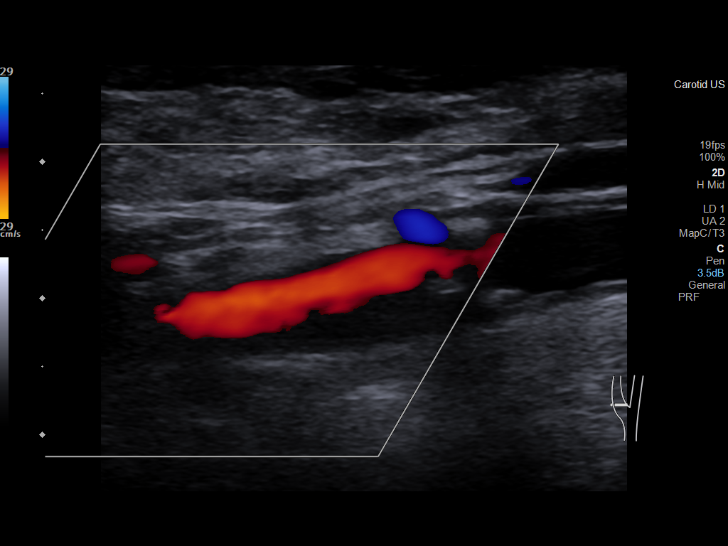
[im 30/68]
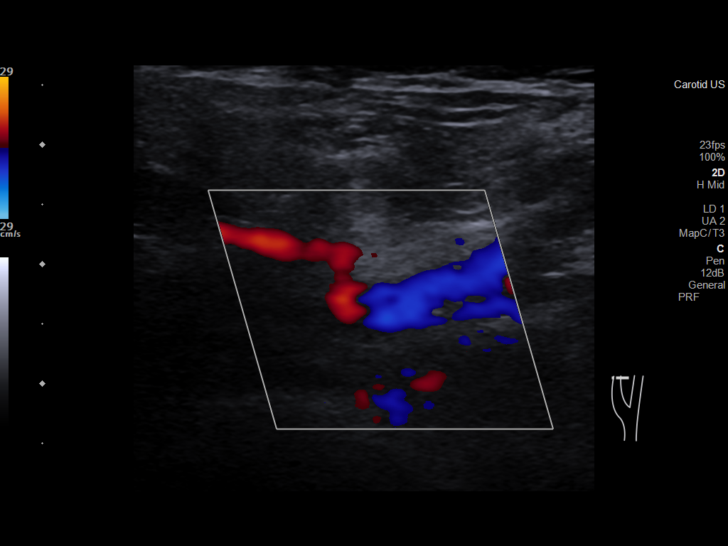
[im 35/68]
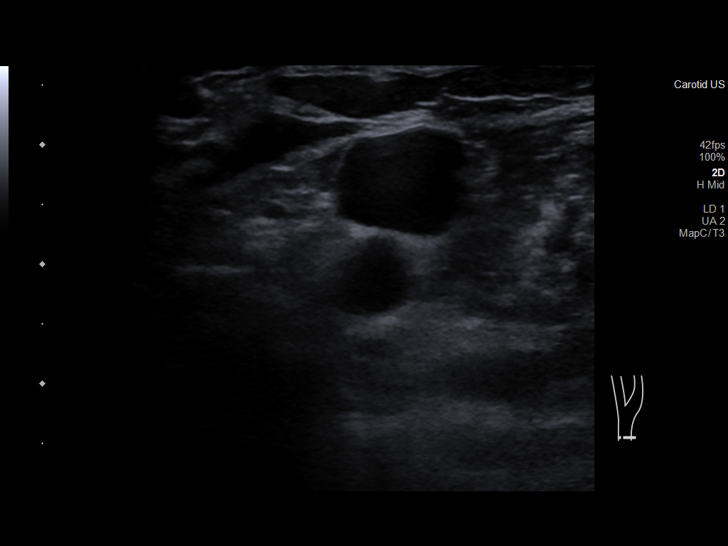
[im 38/68]
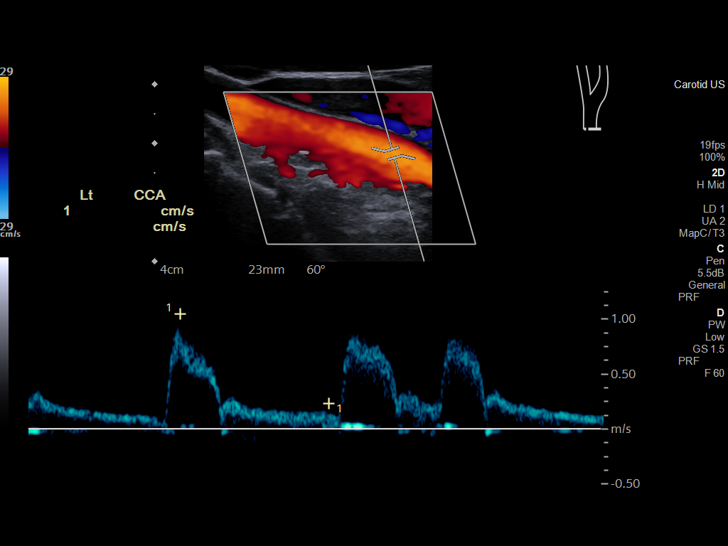
[im 44/68]
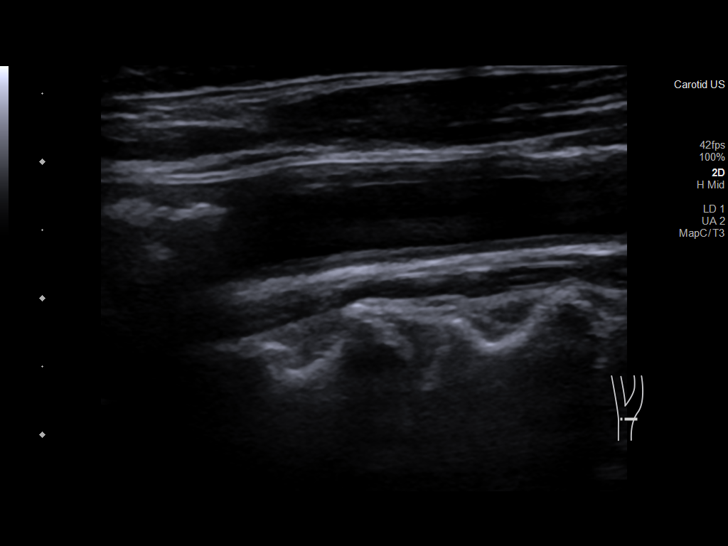
[im 50/68]
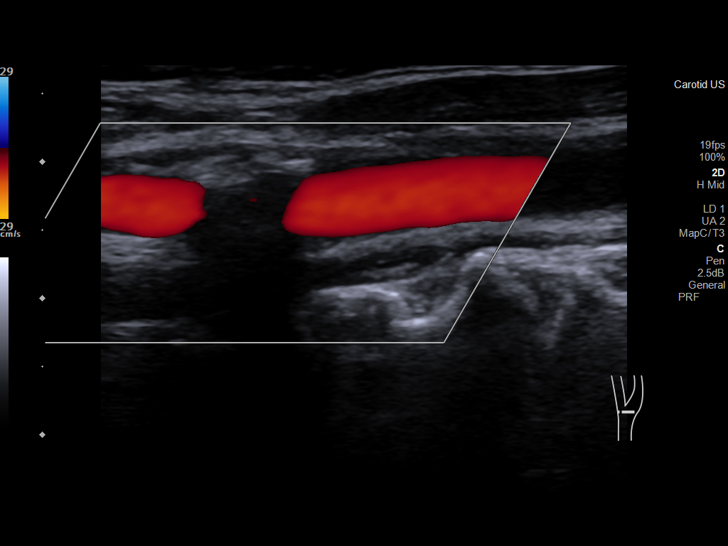
[im 56/68]
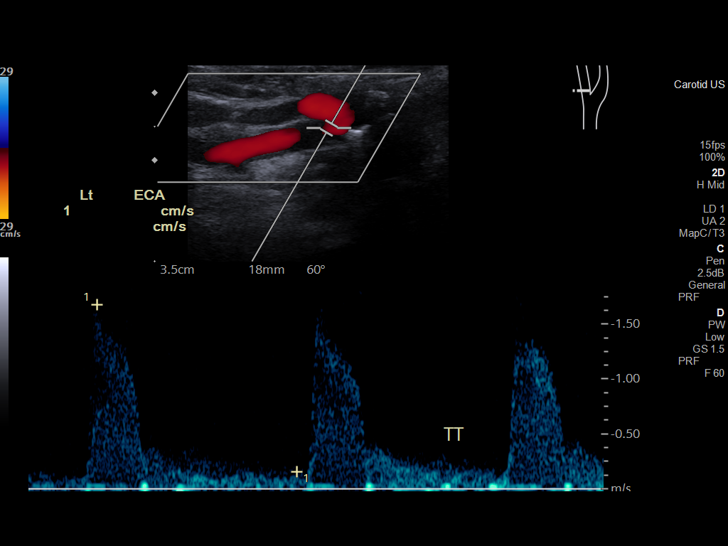
[im 62/68]
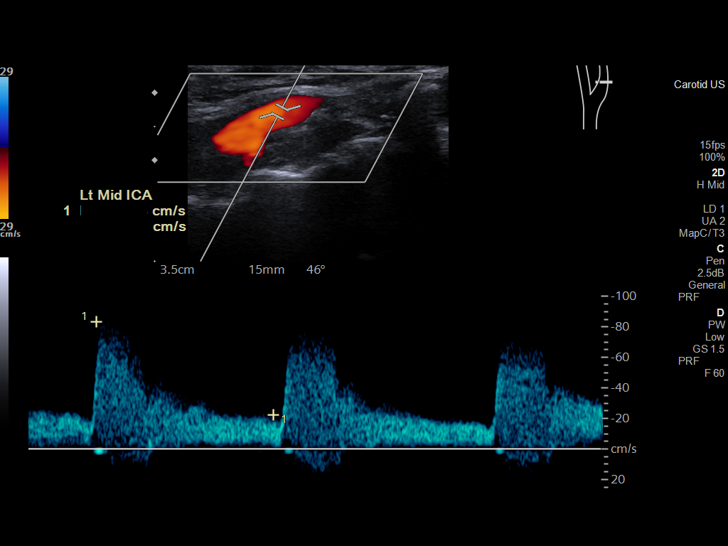
[im 68/68]
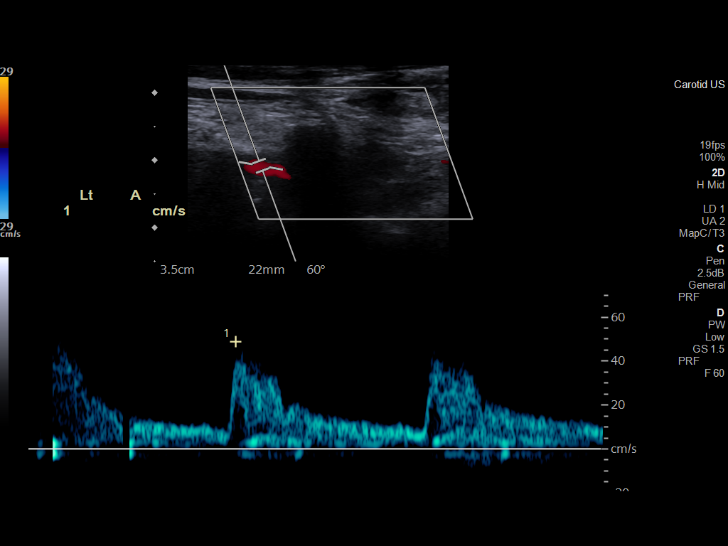

[13 of 24 positions shown; findings below may reference images not displayed]

FINDINGS: Criteria: Quantification of carotid stenosis is based on velocity
parameters that correlate the residual internal carotid diameter
with NASCET-based stenosis levels, using the diameter of the distal
internal carotid lumen as the denominator for stenosis measurement.

The following velocity measurements were obtained:

RIGHT

ICA:  Systolic 80 cm/sec, Diastolic 16 cm/sec

CCA:  98 cm/sec

SYSTOLIC ICA/CCA RATIO:

ECA:  92 cm/sec

LEFT

ICA:  Systolic 107 cm/sec, Diastolic 27 cm/sec

CCA:  82 cm/sec

SYSTOLIC ICA/CCA RATIO:

ECA:  168 cm/sec

Right Brachial SBP: Not acquired

Left Brachial SBP: Not acquired

RIGHT CAROTID ARTERY: No significant calcifications of the right
common carotid artery. Intermediate waveform maintained.
Heterogeneous and partially calcified plaque at the right carotid
bifurcation. No significant lumen shadowing. Low resistance waveform
of the right ICA. No significant tortuosity.

RIGHT VERTEBRAL ARTERY: Antegrade flow with low resistance waveform.

LEFT CAROTID ARTERY: No significant calcifications of the left
common carotid artery. Intermediate waveform maintained.
Heterogeneous and partially calcified plaque at the left carotid
bifurcation without significant lumen shadowing. Low resistance
waveform of the left ICA. No significant tortuosity.

LEFT VERTEBRAL ARTERY:  Antegrade flow with low resistance waveform.
IMPRESSION: Color duplex indicates minimal heterogeneous and calcified plaque,
with no hemodynamically significant stenosis by duplex criteria in
the extracranial cerebrovascular circulation.

## 2021-02-02 DIAGNOSIS — L570 Actinic keratosis: Secondary | ICD-10-CM | POA: Diagnosis not present

## 2021-02-02 DIAGNOSIS — L820 Inflamed seborrheic keratosis: Secondary | ICD-10-CM | POA: Diagnosis not present

## 2021-02-02 DIAGNOSIS — Z08 Encounter for follow-up examination after completed treatment for malignant neoplasm: Secondary | ICD-10-CM | POA: Diagnosis not present

## 2021-02-02 DIAGNOSIS — Z85828 Personal history of other malignant neoplasm of skin: Secondary | ICD-10-CM | POA: Diagnosis not present

## 2021-02-02 DIAGNOSIS — X32XXXD Exposure to sunlight, subsequent encounter: Secondary | ICD-10-CM | POA: Diagnosis not present

## 2021-02-08 ENCOUNTER — Ambulatory Visit: Payer: TRICARE For Life (TFL) | Admitting: Internal Medicine

## 2021-02-19 NOTE — Progress Notes (Signed)
Cardiology Office Note   Date:  02/20/2021   ID:  Danielle Vasquez, DOB 1929-07-19, MRN [id number]  PCP:  Celene Squibb, MD  Cardiologist:   Dorris Carnes, MD   Patient presents for f/u of HTN and PAF    History of Present Illness: Danielle Vasquez is a 87 y.o. female with a history of HTN and PAF  I last saw the pt in clinic in July 2021  Since I saw her she says she is doing OK   She denies palpitations  No SOB  No CP  No dizziness Reviewed labs  LDL 159  She says she is not taking lipitor   Does not want to take any more meds   Current Meds  Medication Sig   atorvastatin (LIPITOR) 10 MG tablet Take 1 tablet (10 mg total) by mouth daily.   Cholecalciferol (VITAMIN D) 1000 UNITS capsule Take 1,000 Units by mouth daily.   ELIQUIS 2.5 MG TABS tablet TAKE 1 TABLET TWICE A DAY. OVERDUE FOR FOLLOW UP. MUST SEE MD FOR FUTURE REFILLS   ezetimibe (ZETIA) 10 MG tablet Take 10 mg by mouth daily.   lisinopril (PRINIVIL,ZESTRIL) 20 MG tablet Take 20 mg by mouth daily.   Omega-3 Fatty Acids (FISH OIL) 1200 MG CAPS Take 1,200 mg by mouth daily.    pantoprazole (PROTONIX) 20 MG tablet Take 20 mg by mouth every morning.   TOPROL XL 25 MG 24 hr tablet TAKE 1 TABLET TWICE A DAY     Allergies:   Patient has no known allergies.   Past Medical History:  Diagnosis Date   Dysrhythmia    fast heart beat"   Fibromyalgia    GERD (gastroesophageal reflux disease)    Glaucoma    HTN (hypertension)    Hyperlipemia     Past Surgical History:  Procedure Laterality Date   CATARACT EXTRACTION W/PHACO  09/01/2012   Procedure: CATARACT EXTRACTION PHACO AND INTRAOCULAR LENS PLACEMENT ([place]);  Surgeon: [name], MD;  Location: [facility];  Service: Ophthalmology;  Laterality: Right;  CDE=11.36   CATARACT EXTRACTION W/PHACO Left 11/03/2012   Procedure: CATARACT EXTRACTION PHACO AND INTRAOCULAR LENS PLACEMENT (IOC);  Surgeon: [name], MD;  Location: [facility];  Service: Ophthalmology;   Laterality: Left;  CDE:  13.74   CHOLECYSTECTOMY     DILATION AND CURETTAGE OF UTERUS     YAG LASER APPLICATION Right 03/02/5283   Procedure: YAG LASER APPLICATION;  Surgeon: [name], MD;  Location: [facility];  Service: Ophthalmology;  Laterality: Right;     Social History:  The patient  reports that she has never smoked. She has never used smokeless tobacco. She reports that she does not drink alcohol and does not use drugs.   Family History:  The patient's family history includes Arthritis in her unknown relative; Heart disease in her unknown relative.    ROS:  Please see the history of present illness. All other systems are reviewed and  Negative to the above problem except as noted.    PHYSICAL EXAM: VS:  BP 134/82   Pulse (!) 56   Ht 5\' 1"  (1.549 m)   Wt 119 lb 9.6 oz (54.3 kg)   SpO2 97%   BMI 22.60 kg/m   GEN: Well nourished, well developed, in no acute distress  HEENT: normal  Neck: no JVD, no carotid bruits Cardiac: RRR; no murmurs; no LE edema  Respiratory:  clear to auscultation bilaterally,  GI: soft, nontender, nondistended, +  BS  No hepatomegaly  MS: no deformity Moving all extremities   Skin: warm and dry, no rash Neuro:  Strength and sensation are intact Psych: euthymic mood, full affect   EKG:  EKG is ordered today.  SR 61 bpm  Incomp RBBB    Lipid Panel    Component Value Date/Time   CHOL 230 (H) 05/10/2020 1050   CHOL 185 08/15/2017 1023   TRIG 91 05/10/2020 1050   HDL 53 05/10/2020 1050   HDL 62 08/15/2017 1023   CHOLHDL 4.3 05/10/2020 1050   VLDL 18 05/10/2020 1050   LDLCALC 159 (H) 05/10/2020 1050   LDLCALC 104 (H) 08/15/2017 1023      Wt Readings from Last 3 Encounters:  02/20/21 119 lb 9.6 oz (54.3 kg)  03/17/20 115 lb (52.2 kg)  12/29/18 115 lb (52.2 kg)      ASSESSMENT AND PLAN:  1  PAF  Pt without signif symptoms   Continue Eliquis  2  HTN    BP is OK  Keep on current regimne    3  LIpids    LDL was over 159   Pt says  she does not want to take any more meds    Discussed diet     4  CV dz  Carotid USN in July 2021 showed minimal plaquing     Follow clinicaly      Current medicines are reviewed at length with the patient today.  The patient does not have concerns regarding medicines.  Signed, Dorris Carnes, MD  02/20/2021 11:31 PM    [facility], [place], [place]  [address] Phone: [phone]; Fax: [phone]

## 2021-02-20 ENCOUNTER — Encounter: Payer: Self-pay | Admitting: Internal Medicine

## 2021-02-20 ENCOUNTER — Other Ambulatory Visit: Payer: Self-pay

## 2021-02-20 ENCOUNTER — Ambulatory Visit (INDEPENDENT_AMBULATORY_CARE_PROVIDER_SITE_OTHER): Payer: Medicare Other | Admitting: Internal Medicine

## 2021-02-20 VITALS — BP 134/82 | HR 56 | Ht 61.0 in | Wt 119.6 lb

## 2021-02-20 DIAGNOSIS — I480 Paroxysmal atrial fibrillation: Secondary | ICD-10-CM

## 2021-02-20 NOTE — Patient Instructions (Signed)
Medication Instructions:  Your physician recommends that you continue on your current medications as directed. Please refer to the Current Medication list given to you today.  *If you need a refill on your cardiac medications before your next appointment, please call your pharmacy*   Lab Work: NONE  If you have labs (blood work) drawn today and your tests are completely normal, you will receive your results only by: [place] (if you have MyChart) OR A paper copy in the mail If you have any lab test that is abnormal or we need to change your treatment, we will call you to review the results.   Testing/Procedures: NONE    Follow-Up: At [facility], you and your health needs are our priority.  As part of our continuing mission to provide you with exceptional heart care, we have created designated Provider Care Teams.  These Care Teams include your primary Cardiologist (physician) and Advanced Practice Providers (APPs -  Physician Assistants and Nurse Practitioners) who all work together to provide you with the care you need, when you need it.  We recommend signing up for the patient portal called "MyChart".  Sign up information is provided on this After Visit Summary.  MyChart is used to connect with patients for Virtual Visits (Telemedicine).  Patients are able to view lab/test results, encounter notes, upcoming appointments, etc.  Non-urgent messages can be sent to your provider as well.   To learn more about what you can do with MyChart, go to [email or web address].    Your next appointment:   1 year(s)  The format for your next appointment:   In Person  Provider:   Dorris Carnes, MD   Other Instructions Thank you for choosing [facility]!

## 2021-03-13 ENCOUNTER — Other Ambulatory Visit (HOSPITAL_COMMUNITY)
Admission: RE | Admit: 2021-03-13 | Discharge: 2021-03-13 | Disposition: A | Discharge status: Home / Self Care | Payer: Medicare Other | Source: Ambulatory Visit | Attending: Internal Medicine | Admitting: Internal Medicine

## 2021-03-13 ENCOUNTER — Other Ambulatory Visit: Payer: Self-pay

## 2021-03-13 DIAGNOSIS — E782 Mixed hyperlipidemia: Secondary | ICD-10-CM | POA: Diagnosis not present

## 2021-03-13 LAB — LIPID PANEL
Cholesterol: 238 mg/dL — ABNORMAL HIGH (ref 0–200)
HDL: 67 mg/dL (ref >40)
LDL Cholesterol: 157 mg/dL — ABNORMAL HIGH (ref 0–99)
Total CHOL/HDL Ratio: 3.6 RATIO
Triglycerides: 72 mg/dL (ref <150)
VLDL: 14 mg/dL (ref 0–40)

## 2021-03-13 LAB — AST: AST: 21 U/L (ref 15–41)

## 2021-03-14 ENCOUNTER — Telehealth: Payer: Self-pay | Admitting: Internal Medicine

## 2021-03-14 NOTE — Telephone Encounter (Signed)
Berlinda Last, [place]  03/14/2021  9:11 AM EDT Back to Top     Left a message for patient to call our office for results.    Fay Records, MD  03/13/2021  3:09 PM EDT      LDL is 157   HDL 67     Watch diet (limit fatty and sugary foods) Pt does not want to take any meds    Patient called w/results

## 2021-03-14 NOTE — Telephone Encounter (Signed)
New message    Patient returning call to [facility] for lab results

## 2021-05-04 DIAGNOSIS — I10 Essential (primary) hypertension: Secondary | ICD-10-CM | POA: Diagnosis not present

## 2021-05-09 DIAGNOSIS — M179 Osteoarthritis of knee, unspecified: Secondary | ICD-10-CM | POA: Diagnosis not present

## 2021-05-09 DIAGNOSIS — L989 Disorder of the skin and subcutaneous tissue, unspecified: Secondary | ICD-10-CM | POA: Diagnosis not present

## 2021-05-09 DIAGNOSIS — E782 Mixed hyperlipidemia: Secondary | ICD-10-CM | POA: Diagnosis not present

## 2021-05-09 DIAGNOSIS — K219 Gastro-esophageal reflux disease without esophagitis: Secondary | ICD-10-CM | POA: Diagnosis not present

## 2021-05-09 DIAGNOSIS — I10 Essential (primary) hypertension: Secondary | ICD-10-CM | POA: Diagnosis not present

## 2021-05-09 DIAGNOSIS — Z0001 Encounter for general adult medical examination with abnormal findings: Secondary | ICD-10-CM | POA: Diagnosis not present

## 2021-05-09 DIAGNOSIS — M791 Myalgia, unspecified site: Secondary | ICD-10-CM | POA: Diagnosis not present

## 2021-05-09 DIAGNOSIS — R944 Abnormal results of kidney function studies: Secondary | ICD-10-CM | POA: Diagnosis not present

## 2021-05-09 DIAGNOSIS — E1369 Other specified diabetes mellitus with other specified complication: Secondary | ICD-10-CM | POA: Diagnosis not present

## 2021-05-09 DIAGNOSIS — I480 Paroxysmal atrial fibrillation: Secondary | ICD-10-CM | POA: Diagnosis not present

## 2021-06-27 ENCOUNTER — Other Ambulatory Visit: Payer: Self-pay | Admitting: Internal Medicine

## 2021-06-27 NOTE — Telephone Encounter (Signed)
Eliquis 2.5mg  refill request received. Patient is 87 years old, weight-54.3kg, Crea-0.83 on 10/27/2020 via scanned labs from PCP, Diagnosis-Afib, and last seen by Dr. Harrington Challenger on 02/20/2021. Dose is appropriate based on dosing criteria. Will send in refill to requested pharmacy.

## 2021-08-21 DIAGNOSIS — E119 Type 2 diabetes mellitus without complications: Secondary | ICD-10-CM | POA: Diagnosis not present

## 2021-11-03 DIAGNOSIS — E782 Mixed hyperlipidemia: Secondary | ICD-10-CM | POA: Diagnosis not present

## 2021-11-03 DIAGNOSIS — E1369 Other specified diabetes mellitus with other specified complication: Secondary | ICD-10-CM | POA: Diagnosis not present

## 2021-11-07 DIAGNOSIS — K219 Gastro-esophageal reflux disease without esophagitis: Secondary | ICD-10-CM | POA: Diagnosis not present

## 2021-11-07 DIAGNOSIS — I480 Paroxysmal atrial fibrillation: Secondary | ICD-10-CM | POA: Diagnosis not present

## 2021-11-07 DIAGNOSIS — R944 Abnormal results of kidney function studies: Secondary | ICD-10-CM | POA: Diagnosis not present

## 2021-11-07 DIAGNOSIS — E782 Mixed hyperlipidemia: Secondary | ICD-10-CM | POA: Diagnosis not present

## 2021-11-07 DIAGNOSIS — M179 Osteoarthritis of knee, unspecified: Secondary | ICD-10-CM | POA: Diagnosis not present

## 2021-11-07 DIAGNOSIS — L989 Disorder of the skin and subcutaneous tissue, unspecified: Secondary | ICD-10-CM | POA: Diagnosis not present

## 2021-11-07 DIAGNOSIS — I10 Essential (primary) hypertension: Secondary | ICD-10-CM | POA: Diagnosis not present

## 2021-11-07 DIAGNOSIS — E1369 Other specified diabetes mellitus with other specified complication: Secondary | ICD-10-CM | POA: Diagnosis not present

## 2021-11-07 DIAGNOSIS — M791 Myalgia, unspecified site: Secondary | ICD-10-CM | POA: Diagnosis not present

## 2022-01-22 ENCOUNTER — Other Ambulatory Visit: Payer: Self-pay | Admitting: Internal Medicine

## 2022-01-22 NOTE — Telephone Encounter (Signed)
Age 88, weight 54kg, SCr 0.76 on 11/2021, last OV 02/2021, afib indication

## 2022-02-08 DIAGNOSIS — Z85828 Personal history of other malignant neoplasm of skin: Secondary | ICD-10-CM | POA: Diagnosis not present

## 2022-02-08 DIAGNOSIS — D225 Melanocytic nevi of trunk: Secondary | ICD-10-CM | POA: Diagnosis not present

## 2022-02-08 DIAGNOSIS — L570 Actinic keratosis: Secondary | ICD-10-CM | POA: Diagnosis not present

## 2022-02-08 DIAGNOSIS — Z08 Encounter for follow-up examination after completed treatment for malignant neoplasm: Secondary | ICD-10-CM | POA: Diagnosis not present

## 2022-02-08 DIAGNOSIS — X32XXXD Exposure to sunlight, subsequent encounter: Secondary | ICD-10-CM | POA: Diagnosis not present

## 2022-05-04 DIAGNOSIS — E782 Mixed hyperlipidemia: Secondary | ICD-10-CM | POA: Diagnosis not present

## 2022-05-04 DIAGNOSIS — E1369 Other specified diabetes mellitus with other specified complication: Secondary | ICD-10-CM | POA: Diagnosis not present

## 2022-05-09 DIAGNOSIS — E782 Mixed hyperlipidemia: Secondary | ICD-10-CM | POA: Diagnosis not present

## 2022-05-09 DIAGNOSIS — L989 Disorder of the skin and subcutaneous tissue, unspecified: Secondary | ICD-10-CM | POA: Diagnosis not present

## 2022-05-09 DIAGNOSIS — R944 Abnormal results of kidney function studies: Secondary | ICD-10-CM | POA: Diagnosis not present

## 2022-05-09 DIAGNOSIS — M179 Osteoarthritis of knee, unspecified: Secondary | ICD-10-CM | POA: Diagnosis not present

## 2022-05-09 DIAGNOSIS — I480 Paroxysmal atrial fibrillation: Secondary | ICD-10-CM | POA: Diagnosis not present

## 2022-05-09 DIAGNOSIS — K219 Gastro-esophageal reflux disease without esophagitis: Secondary | ICD-10-CM | POA: Diagnosis not present

## 2022-05-09 DIAGNOSIS — I10 Essential (primary) hypertension: Secondary | ICD-10-CM | POA: Diagnosis not present

## 2022-05-09 DIAGNOSIS — E1369 Other specified diabetes mellitus with other specified complication: Secondary | ICD-10-CM | POA: Diagnosis not present

## 2022-05-09 DIAGNOSIS — G720 Drug-induced myopathy: Secondary | ICD-10-CM | POA: Diagnosis not present

## 2022-05-09 DIAGNOSIS — Z6821 Body mass index (BMI) 21.0-21.9, adult: Secondary | ICD-10-CM | POA: Diagnosis not present

## 2022-07-23 ENCOUNTER — Other Ambulatory Visit: Payer: Self-pay | Admitting: Internal Medicine

## 2022-07-23 NOTE — Telephone Encounter (Addendum)
Prescription refill request for Eliquis received. Indication:afib Last office visit:needs appt Scr:0.7 Age: 88 Weight:54.3 kg  Prescription refilled

## 2022-08-23 DIAGNOSIS — H04123 Dry eye syndrome of bilateral lacrimal glands: Secondary | ICD-10-CM | POA: Diagnosis not present

## 2022-09-06 DIAGNOSIS — E1369 Other specified diabetes mellitus with other specified complication: Secondary | ICD-10-CM | POA: Diagnosis not present

## 2022-09-06 DIAGNOSIS — E782 Mixed hyperlipidemia: Secondary | ICD-10-CM | POA: Diagnosis not present

## 2022-09-10 DIAGNOSIS — G720 Drug-induced myopathy: Secondary | ICD-10-CM | POA: Diagnosis not present

## 2022-09-10 DIAGNOSIS — E1369 Other specified diabetes mellitus with other specified complication: Secondary | ICD-10-CM | POA: Diagnosis not present

## 2022-09-10 DIAGNOSIS — K219 Gastro-esophageal reflux disease without esophagitis: Secondary | ICD-10-CM | POA: Diagnosis not present

## 2022-09-10 DIAGNOSIS — I10 Essential (primary) hypertension: Secondary | ICD-10-CM | POA: Diagnosis not present

## 2022-09-10 DIAGNOSIS — I480 Paroxysmal atrial fibrillation: Secondary | ICD-10-CM | POA: Diagnosis not present

## 2022-09-10 DIAGNOSIS — L989 Disorder of the skin and subcutaneous tissue, unspecified: Secondary | ICD-10-CM | POA: Diagnosis not present

## 2022-09-10 DIAGNOSIS — E1169 Type 2 diabetes mellitus with other specified complication: Secondary | ICD-10-CM | POA: Diagnosis not present

## 2022-09-10 DIAGNOSIS — E782 Mixed hyperlipidemia: Secondary | ICD-10-CM | POA: Diagnosis not present

## 2022-09-10 DIAGNOSIS — M170 Bilateral primary osteoarthritis of knee: Secondary | ICD-10-CM | POA: Diagnosis not present

## 2022-09-10 DIAGNOSIS — Z0000 Encounter for general adult medical examination without abnormal findings: Secondary | ICD-10-CM | POA: Diagnosis not present

## 2022-09-10 DIAGNOSIS — R944 Abnormal results of kidney function studies: Secondary | ICD-10-CM | POA: Diagnosis not present

## 2022-10-19 ENCOUNTER — Other Ambulatory Visit: Payer: Self-pay | Admitting: Internal Medicine

## 2022-10-19 NOTE — Telephone Encounter (Signed)
Eliquis 2.53m refill request received. Patient is 89years old, weight-54.3kg, Crea-0.76 on 11/03/2021 via scanned labs from PCP, Diagnosis-Afib, and last seen by Dr. RHarrington Challengeron 02/20/2021-Needs Appt. Dose is appropriate based on dosing criteria.   Sending a message to Schedulers to schedule pt an appt.

## 2022-11-19 ENCOUNTER — Telehealth: Payer: Self-pay | Admitting: Internal Medicine

## 2022-11-19 MED ORDER — APIXABAN 2.5 MG PO TABS: 2.5000 mg | ORAL_TABLET | Freq: Two times a day (BID) | ORAL | 0 refills | Status: DC

## 2022-11-19 NOTE — Telephone Encounter (Signed)
Prescription refill request for Eliquis received. Indication: PAF Last office visit: 02/20/21  Lizbeth Bark MD (appt 12/13/22) Scr: 0.73 on 09/06/22 Age: 89 Weight: 54.3kg  Based on above findings Eliquis 2.5mg  daily is the appropriate dose.  Pt has an appt with Dr Harrington Challenger on 12/13/22 as she is past due.  Refill approved x 1 only till after appt.

## 2022-11-19 NOTE — Telephone Encounter (Signed)
*  STAT* If patient is at the pharmacy, call can be transferred to refill team.   1. Which medications need to be refilled? (please list name of each medication and dose if known) new prescription for Eliquis  2. Which pharmacy/location (including street and city if local pharmacy) is medication to be sent to?[facility]  3. Do they need a 30 day or 90 day supply? 90 days an refills- need this called in [place]

## 2022-12-12 NOTE — Progress Notes (Unsigned)
Cardiology Office Note   Date:  12/13/2022   ID:  Danielle Vasquez, DOB December 13, 1928, MRN [id number]  PCP:  Benita Stabile, MD  Cardiologist:   Dietrich Pates, MD   Patient presents for f/u of HTN and PAF    History of Present Illness: Danielle Vasquez is a 89 y.o. female with a history of HTN and PAF  I saw the pt in clinic in June 2022 Pt says she has been doing good   No CP  no SOB   No palpitations   No dizziness  BP at home high but not as high as it is today   Current Meds  Medication Sig   apixaban (ELIQUIS) 2.5 MG TABS tablet Take 1 tablet (2.5 mg total) by mouth 2 (two) times daily.   atorvastatin (LIPITOR) 10 MG tablet Take 1 tablet (10 mg total) by mouth daily.   Cholecalciferol (VITAMIN D) 1000 UNITS capsule Take 1,000 Units by mouth daily.   ezetimibe (ZETIA) 10 MG tablet Take 10 mg by mouth daily.   lisinopril (PRINIVIL,ZESTRIL) 20 MG tablet Take 20 mg by mouth daily.   metoprolol tartrate (LOPRESSOR) 25 MG tablet daily at 6 (six) AM.   Omega-3 Fatty Acids (FISH OIL) 1200 MG CAPS Take 1,200 mg by mouth daily.    pantoprazole (PROTONIX) 20 MG tablet Take 20 mg by mouth every morning.   [DISCONTINUED] TOPROL XL 25 MG 24 hr tablet TAKE 1 TABLET TWICE A DAY     Allergies:   Patient has no known allergies.   Past Medical History:  Diagnosis Date   Dysrhythmia    fast heart beat"   Fibromyalgia    GERD (gastroesophageal reflux disease)    Glaucoma    HTN (hypertension)    Hyperlipemia     Past Surgical History:  Procedure Laterality Date   CATARACT EXTRACTION W/PHACO  09/01/2012   Procedure: CATARACT EXTRACTION PHACO AND INTRAOCULAR LENS PLACEMENT (IOC);  Surgeon: Susa Simmonds, MD;  Location: [facility];  Service: Ophthalmology;  Laterality: Right;  CDE=11.36   CATARACT EXTRACTION W/PHACO Left 11/03/2012   Procedure: CATARACT EXTRACTION PHACO AND INTRAOCULAR LENS PLACEMENT (IOC);  Surgeon: Susa Simmonds, MD;  Location: [facility];  Service: Ophthalmology;   Laterality: Left;  CDE:  13.74   CHOLECYSTECTOMY     DILATION AND CURETTAGE OF UTERUS     YAG LASER APPLICATION Right 05/28/2016   Procedure: YAG LASER APPLICATION;  Surgeon: Susa Simmonds, MD;  Location: [facility];  Service: Ophthalmology;  Laterality: Right;     Social History:  The patient  reports that she has never smoked. She has never used smokeless tobacco. She reports that she does not drink alcohol and does not use drugs.   Family History:  The patient's family history includes Arthritis in her unknown relative; Heart disease in her unknown relative.    ROS:  Please see the history of present illness. All other systems are reviewed and  Negative to the above problem except as noted.    PHYSICAL EXAM: VS:  BP (!) 183/75   Pulse (!) 56   Ht 5\' 1"  (1.549 m)   Wt 116 lb (52.6 kg)   SpO2 98%   BMI 21.92 kg/m   GEN:  Thin 89 yo  in no acute distress  HEENT: normal  Neck: no JVD, no carotid bruit Cardiac: RRR; no murmur   no LE edema  Respiratory:  clear to auscultation GI: soft, nontender, nondistended  No hepatomegaly  MS: no deformity Moving all extremities   Skin: warm and dry, no rash Neuro:  Strength and sensation are intact Psych: euthymic mood, full affect   EKG:  EKG is ordered today.  SR 56 RBBB     Lipid Panel    Component Value Date/Time   CHOL 238 (H) 03/13/2021 0905   CHOL 185 08/15/2017 1023   TRIG 72 03/13/2021 0905   HDL 67 03/13/2021 0905   HDL 62 08/15/2017 1023   CHOLHDL 3.6 03/13/2021 0905   VLDL 14 03/13/2021 0905   LDLCALC 157 (H) 03/13/2021 0905   LDLCALC 104 (H) 08/15/2017 1023      Wt Readings from Last 3 Encounters:  12/13/22 116 lb (52.6 kg)  02/20/21 119 lb 9.6 oz (54.3 kg)  03/17/20 115 lb (52.2 kg)      ASSESSMENT AND PLAN:  1  PAF  Denies palpitations  Continue Eliquis  2  HTN    BP iBP is elevated   Add 25 mg HCTZ to lisinpril 20   Keep track of BP Check BMET in 10 days in [place]    3  LIpids    LDL was 138    Would increase lipitor to 20 mg   Follow     4  CV dz  Carotid USN in July 2021 showed minimal plaquing     Follow clinicaly   Follow up in clinic for BP check in [place] in June / July   Bring meds       Current medicines are reviewed at length with the patient today.  The patient does not have concerns regarding medicines.  Signed, Dietrich Pates, MD  12/13/2022 3:26 PM    [facility] [address] [place], [place], [place]  [address] Phone: [phone]; Fax: [phone]

## 2022-12-13 ENCOUNTER — Ambulatory Visit: Payer: Medicare Other | Attending: Internal Medicine | Admitting: Internal Medicine

## 2022-12-13 ENCOUNTER — Encounter: Payer: Self-pay | Admitting: Internal Medicine

## 2022-12-13 VITALS — BP 183/75 | HR 56 | Ht 61.0 in | Wt 116.0 lb

## 2022-12-13 DIAGNOSIS — E782 Mixed hyperlipidemia: Secondary | ICD-10-CM | POA: Diagnosis not present

## 2022-12-13 DIAGNOSIS — I480 Paroxysmal atrial fibrillation: Secondary | ICD-10-CM

## 2022-12-13 DIAGNOSIS — Z79899 Other long term (current) drug therapy: Secondary | ICD-10-CM | POA: Diagnosis not present

## 2022-12-13 MED ORDER — HYDROCHLOROTHIAZIDE 25 MG PO TABS: 25.0000 mg | ORAL_TABLET | Freq: Every day | ORAL | 3 refills | Status: DC

## 2022-12-13 MED ORDER — ATORVASTATIN CALCIUM 20 MG PO TABS: 20.0000 mg | ORAL_TABLET | Freq: Every day | ORAL | 3 refills | Status: DC

## 2022-12-13 NOTE — Patient Instructions (Signed)
Medication Instructions:  INCREASE ATORVASTATIN TO 20 MG DAILY START HCTZ 25 MG DAILY   *If you need a refill on your cardiac medications before your next appointment, please call your pharmacy*   Lab Work: BMET IN 10 DAYS AT [facility] BY Spencer PENN   If you have labs (blood work) drawn today and your tests are completely normal, you will receive your results only by: MyChart Message (if you have MyChart) OR A paper copy in the mail If you have any lab test that is abnormal or we need to change your treatment, we will call you to review the results.   Testing/Procedures:    Follow-Up: At [facility], you and your health needs are our priority.  As part of our continuing mission to provide you with exceptional heart care, we have created designated Provider Care Teams.  These Care Teams include your primary Cardiologist (physician) and Advanced Practice Providers (APPs -  Physician Assistants and Nurse Practitioners) who all work together to provide you with the care you need, when you need it.  We recommend signing up for the patient portal called "MyChart".  Sign up information is provided on this After Visit Summary.  MyChart is used to connect with patients for Virtual Visits (Telemedicine).  Patients are able to view lab/test results, encounter notes, upcoming appointments, etc.  Non-urgent messages can be sent to your provider as well.   To learn more about what you can do with MyChart, go to [email or web address].    Your next appointment:   3 month(s) ANYTIME THIS SUMMER IN [place]   Provider:   Dietrich Pates, MD    Other Instructions

## 2022-12-17 ENCOUNTER — Telehealth: Payer: Self-pay | Admitting: Internal Medicine

## 2022-12-17 NOTE — Telephone Encounter (Signed)
Returned call to patient. Reviewed medication list and last OV note with Dr. Tenny Craw. Patient is to take atorvastatin 20mg  QD and HCTZ 25mg  QD in addition to her current medications.   Patient will have labs drawn in 10 days to check kidney function. Informed patient she will continue to take medications as currently prescribed until notified otherwise by our office after her lab results are reviewed by MD.  Patient verbalized understanding.

## 2022-12-17 NOTE — Telephone Encounter (Signed)
Returned call to patient and informed her that she will want to start the new Rx for HCTZ and increased atorvastatin followed by labwork in 10 days. Explained to patient this is to check her kidney function after starting the HCTZ.  Patient verbalized understanding and expressed appreciation for call.

## 2022-12-17 NOTE — Telephone Encounter (Signed)
Pt c/o medication issue:  1. Name of Medication:   atorvastatin (LIPITOR) 20 MG tablet  hydrochlorothiazide (HYDRODIURIL) 25 MG tablet   2. How are you currently taking this medication (dosage and times per day)?   3. Are you having a reaction (difficulty breathing--STAT)?   4. What is your medication issue?   Patient wants to confirm she should be taking these two additional medications in addition to the other medications she is taking.

## 2022-12-17 NOTE — Telephone Encounter (Signed)
Patient is calling in for clarification, on if she suppose to have the blood work before taking the medication or medication before blood work. Please advise

## 2022-12-28 ENCOUNTER — Other Ambulatory Visit: Payer: Self-pay

## 2022-12-28 DIAGNOSIS — I480 Paroxysmal atrial fibrillation: Secondary | ICD-10-CM

## 2022-12-28 DIAGNOSIS — E782 Mixed hyperlipidemia: Secondary | ICD-10-CM | POA: Diagnosis not present

## 2022-12-28 DIAGNOSIS — Z79899 Other long term (current) drug therapy: Secondary | ICD-10-CM

## 2022-12-29 LAB — BASIC METABOLIC PANEL
BUN/Creatinine Ratio: 33 — ABNORMAL HIGH (ref 12–28)
BUN: 27 mg/dL (ref 10–36)
CO2: 25 mmol/L (ref 20–29)
Calcium: 10.2 mg/dL (ref 8.7–10.3)
Chloride: 98 mmol/L (ref 96–106)
Creatinine, Ser: 0.81 mg/dL (ref 0.57–1.00)
Glucose: 108 mg/dL — ABNORMAL HIGH (ref 70–99)
Potassium: 4.6 mmol/L (ref 3.5–5.2)
Sodium: 137 mmol/L (ref 134–144)
eGFR: 68 mL/min/{1.73_m2} (ref >59)

## 2022-12-29 LAB — PRO B NATRIURETIC PEPTIDE: NT-Pro BNP: 240 pg/mL (ref 0–738)

## 2023-01-01 ENCOUNTER — Telehealth: Payer: Self-pay

## 2023-01-01 NOTE — Telephone Encounter (Signed)
Patient is aware of lab results. Verbalized understanding. 

## 2023-01-01 NOTE — Telephone Encounter (Signed)
-----   Message from Dietrich Pates V, MD sent at 01/01/2023 10:06 AM EDT ----- Electrolytes and kidney function are normal Fluid number is normal Keep on same meds   Follow BP

## 2023-01-30 ENCOUNTER — Other Ambulatory Visit: Payer: Self-pay | Admitting: Internal Medicine

## 2023-01-30 NOTE — Telephone Encounter (Signed)
Pt last saw Dr Tenny Craw 12/13/22, last labs 12/28/22 Creat 0.81, age 89, weight 52.6kg, based on specified criteria pt is on appropriate dosage of Eliquis 2.5mg  BID for afib.  Will refill rx.

## 2023-02-21 DIAGNOSIS — Z08 Encounter for follow-up examination after completed treatment for malignant neoplasm: Secondary | ICD-10-CM | POA: Diagnosis not present

## 2023-02-21 DIAGNOSIS — D225 Melanocytic nevi of trunk: Secondary | ICD-10-CM | POA: Diagnosis not present

## 2023-02-21 DIAGNOSIS — L570 Actinic keratosis: Secondary | ICD-10-CM | POA: Diagnosis not present

## 2023-02-21 DIAGNOSIS — X32XXXD Exposure to sunlight, subsequent encounter: Secondary | ICD-10-CM | POA: Diagnosis not present

## 2023-02-21 DIAGNOSIS — L821 Other seborrheic keratosis: Secondary | ICD-10-CM | POA: Diagnosis not present

## 2023-02-21 DIAGNOSIS — Z85828 Personal history of other malignant neoplasm of skin: Secondary | ICD-10-CM | POA: Diagnosis not present

## 2023-03-11 DIAGNOSIS — E782 Mixed hyperlipidemia: Secondary | ICD-10-CM | POA: Diagnosis not present

## 2023-03-11 DIAGNOSIS — E1369 Other specified diabetes mellitus with other specified complication: Secondary | ICD-10-CM | POA: Diagnosis not present

## 2023-03-12 LAB — LAB REPORT - SCANNED
A1c: 6
Albumin, Urine POC: 6.6
Creatinine, POC: 66.2 mg/dL
EGFR: 74
Microalb Creat Ratio: 10

## 2023-03-15 DIAGNOSIS — Z6822 Body mass index (BMI) 22.0-22.9, adult: Secondary | ICD-10-CM | POA: Diagnosis not present

## 2023-03-15 DIAGNOSIS — M179 Osteoarthritis of knee, unspecified: Secondary | ICD-10-CM | POA: Diagnosis not present

## 2023-03-15 DIAGNOSIS — E118 Type 2 diabetes mellitus with unspecified complications: Secondary | ICD-10-CM | POA: Diagnosis not present

## 2023-03-15 DIAGNOSIS — I10 Essential (primary) hypertension: Secondary | ICD-10-CM | POA: Diagnosis not present

## 2023-03-15 DIAGNOSIS — R944 Abnormal results of kidney function studies: Secondary | ICD-10-CM | POA: Diagnosis not present

## 2023-03-15 DIAGNOSIS — K219 Gastro-esophageal reflux disease without esophagitis: Secondary | ICD-10-CM | POA: Diagnosis not present

## 2023-03-15 DIAGNOSIS — M791 Myalgia, unspecified site: Secondary | ICD-10-CM | POA: Diagnosis not present

## 2023-03-15 DIAGNOSIS — E782 Mixed hyperlipidemia: Secondary | ICD-10-CM | POA: Diagnosis not present

## 2023-03-15 DIAGNOSIS — E1369 Other specified diabetes mellitus with other specified complication: Secondary | ICD-10-CM | POA: Diagnosis not present

## 2023-03-15 DIAGNOSIS — I480 Paroxysmal atrial fibrillation: Secondary | ICD-10-CM | POA: Diagnosis not present

## 2023-03-15 DIAGNOSIS — Z713 Dietary counseling and surveillance: Secondary | ICD-10-CM | POA: Diagnosis not present

## 2023-03-15 DIAGNOSIS — L989 Disorder of the skin and subcutaneous tissue, unspecified: Secondary | ICD-10-CM | POA: Diagnosis not present

## 2023-03-25 ENCOUNTER — Encounter: Payer: Self-pay | Admitting: Internal Medicine

## 2023-03-25 ENCOUNTER — Ambulatory Visit: Payer: Medicare Other | Attending: Internal Medicine | Admitting: Internal Medicine

## 2023-03-25 VITALS — BP 118/80 | HR 80 | Ht 61.0 in | Wt 117.0 lb

## 2023-03-25 DIAGNOSIS — I480 Paroxysmal atrial fibrillation: Secondary | ICD-10-CM | POA: Insufficient documentation

## 2023-03-25 NOTE — Progress Notes (Signed)
Cardiology Office Note   Date:  03/25/2023   ID:  Danielle Vasquez, DOB 05/05/1929, MRN [id number]  PCP:  Benita Stabile, MD  Cardiologist:   Dietrich Pates, MD   Patient presents for f/u of HTN and PAF    History of Present Illness: Danielle Vasquez is a 89 y.o. female with a history of HTN and PAF  I saw Danielle Vasquez in clinic in APril 2024 Since seen she says she feels good  Denies dizziness No SOB  No palpitations   No CP  Current Meds  Medication Sig   apixaban (ELIQUIS) 2.5 MG TABS tablet Take 1 tablet (2.5 mg total) by mouth 2 (two) times daily.   Cholecalciferol (VITAMIN D) 1000 UNITS capsule Take 1,000 Units by mouth daily.   ezetimibe (ZETIA) 10 MG tablet Take 10 mg by mouth daily.   lisinopril (PRINIVIL,ZESTRIL) 20 MG tablet Take 20 mg by mouth daily.   metoprolol tartrate (LOPRESSOR) 25 MG tablet daily at 6 (six) AM.   Omega-3 Fatty Acids (FISH OIL) 1200 MG CAPS Take 1,200 mg by mouth daily.    pantoprazole (PROTONIX) 20 MG tablet Take 20 mg by mouth every morning.     Allergies:   Patient has no known allergies.   Past Medical History:  Diagnosis Date   Dysrhythmia    fast heart beat"   Fibromyalgia    GERD (gastroesophageal reflux disease)    Glaucoma    HTN (hypertension)    Hyperlipemia     Past Surgical History:  Procedure Laterality Date   CATARACT EXTRACTION W/PHACO  09/01/2012   Procedure: CATARACT EXTRACTION PHACO AND INTRAOCULAR LENS PLACEMENT (IOC);  Surgeon: Susa Simmonds, MD;  Location: [facility];  Service: Ophthalmology;  Laterality: Right;  CDE=11.36   CATARACT EXTRACTION W/PHACO Left 11/03/2012   Procedure: CATARACT EXTRACTION PHACO AND INTRAOCULAR LENS PLACEMENT (IOC);  Surgeon: Susa Simmonds, MD;  Location: [facility];  Service: Ophthalmology;  Laterality: Left;  CDE:  13.74   CHOLECYSTECTOMY     DILATION AND CURETTAGE OF UTERUS     YAG LASER APPLICATION Right 05/28/2016   Procedure: YAG LASER APPLICATION;  Surgeon: Susa Simmonds, MD;   Location: [facility];  Service: Ophthalmology;  Laterality: Right;     Social History:  Danielle patient  reports that she has never smoked. She has never used smokeless tobacco. She reports that she does not drink alcohol and does not use drugs.   Family History:  Danielle patient's family history includes Arthritis in her unknown relative; Heart disease in her unknown relative.    ROS:  Please see Danielle history of present illness. All other systems are reviewed and  Negative to Danielle above problem except as noted.    PHYSICAL EXAM: VS:  BP 118/80 (BP Location: Right Arm, Patient Position: Sitting, Cuff Size: Normal)   Pulse 80   Ht 5\' 1"  (1.549 m)   Wt 117 lb (53.1 kg)   SpO2 98%   BMI 22.11 kg/m   GEN:  Thin 89 yo  in no acute distress  HEENT: normal  Neck: no JVD Cardiac: RRR; no murmur   No  LE edema  Respiratory:  clear to auscultation bilaterally  GI: soft, nontender, nondistended   EKG:  EKG is not ordered today.     Lipid Panel    Component Value Date/Time   CHOL 238 (H) 03/13/2021 0905   CHOL 185 08/15/2017 1023   TRIG 72 03/13/2021 0905   HDL 67  03/13/2021 0905   HDL 62 08/15/2017 1023   CHOLHDL 3.6 03/13/2021 0905   VLDL 14 03/13/2021 0905   LDLCALC 157 (H) 03/13/2021 0905   LDLCALC 104 (H) 08/15/2017 1023      Wt Readings from Last 3 Encounters:  03/25/23 117 lb (53.1 kg)  12/13/22 116 lb (52.6 kg)  02/20/21 119 lb 9.6 oz (54.3 kg)      ASSESSMENT AND PLAN:  1  PAF  Denies palpitations  Clinically in SR   Keep on Eliquis  2  HTN   Blood pressure is good on current regimen  COntinue  3  LIpids    LDL 146   July 2024  She is on Lipitor and Zetia Not clear if she is taking regularly  She is tolerating  Keep on for now    4  CV dz  Carotid USN in July 2021 showed minimal plaquing     Follow clinicaly   Follow up next spring   Current medicines are reviewed at length with Danielle patient today.  Danielle patient does not have concerns regarding  medicines.  Signed, Dietrich Pates, MD  03/25/2023 10:11 AM    [facility] [address] [place], [place], [place]  [address] Phone: [phone]; Fax: [phone]

## 2023-03-25 NOTE — Patient Instructions (Signed)
Medication Instructions:  Your physician recommends that you continue on your current medications as directed. Please refer to the Current Medication list given to you today.  *If you need a refill on your cardiac medications before your next appointment, please call your pharmacy*   Lab Work: None   If you have labs (blood work) drawn today and your tests are completely normal, you will receive your results only by: MyChart Message (if you have MyChart) OR A paper copy in the mail If you have any lab test that is abnormal or we need to change your treatment, we will call you to review the results.   Testing/Procedures: NONE    Follow-Up: At [facility], you and your health needs are our priority.  As part of our continuing mission to provide you with exceptional heart care, we have created designated Provider Care Teams.  These Care Teams include your primary Cardiologist (physician) and Advanced Practice Providers (APPs -  Physician Assistants and Nurse Practitioners) who all work together to provide you with the care you need, when you need it.  We recommend signing up for the patient portal called "MyChart".  Sign up information is provided on this After Visit Summary.  MyChart is used to connect with patients for Virtual Visits (Telemedicine).  Patients are able to view lab/test results, encounter notes, upcoming appointments, etc.  Non-urgent messages can be sent to your provider as well.   To learn more about what you can do with MyChart, go to [email or web address].    Your next appointment:    February / March   Provider:   You may see Dietrich Pates, MD or one of the following Advanced Practice Providers on your designated Care Team:   Randall An, PA-C  Jacolyn Reedy, PA-C     Other Instructions Thank you for choosing [facility]!

## 2023-08-29 DIAGNOSIS — H04123 Dry eye syndrome of bilateral lacrimal glands: Secondary | ICD-10-CM | POA: Diagnosis not present

## 2023-09-11 DIAGNOSIS — E1369 Other specified diabetes mellitus with other specified complication: Secondary | ICD-10-CM | POA: Diagnosis not present

## 2023-09-11 DIAGNOSIS — E782 Mixed hyperlipidemia: Secondary | ICD-10-CM | POA: Diagnosis not present

## 2023-09-13 LAB — LAB REPORT - SCANNED
A1c: 5.9
Albumin, Urine POC: 13.2
Creatinine, POC: 61.1 mg/dL
EGFR: 62
Microalb Creat Ratio: 22

## 2023-09-16 DIAGNOSIS — L989 Disorder of the skin and subcutaneous tissue, unspecified: Secondary | ICD-10-CM | POA: Diagnosis not present

## 2023-09-16 DIAGNOSIS — I10 Essential (primary) hypertension: Secondary | ICD-10-CM | POA: Diagnosis not present

## 2023-09-16 DIAGNOSIS — M179 Osteoarthritis of knee, unspecified: Secondary | ICD-10-CM | POA: Diagnosis not present

## 2023-09-16 DIAGNOSIS — I480 Paroxysmal atrial fibrillation: Secondary | ICD-10-CM | POA: Diagnosis not present

## 2023-09-16 DIAGNOSIS — R944 Abnormal results of kidney function studies: Secondary | ICD-10-CM | POA: Diagnosis not present

## 2023-09-16 DIAGNOSIS — E782 Mixed hyperlipidemia: Secondary | ICD-10-CM | POA: Diagnosis not present

## 2023-09-16 DIAGNOSIS — K219 Gastro-esophageal reflux disease without esophagitis: Secondary | ICD-10-CM | POA: Diagnosis not present

## 2023-09-16 DIAGNOSIS — G720 Drug-induced myopathy: Secondary | ICD-10-CM | POA: Diagnosis not present

## 2023-09-16 DIAGNOSIS — E1369 Other specified diabetes mellitus with other specified complication: Secondary | ICD-10-CM | POA: Diagnosis not present

## 2023-10-28 ENCOUNTER — Other Ambulatory Visit: Payer: Self-pay | Admitting: Internal Medicine

## 2023-10-28 NOTE — Telephone Encounter (Signed)
 Prescription refill request for Eliquis received. Indication:afib Last office visit:7/24 Scr:0.87  1/25 Age: 89 Weight:53.1  kg  Prescription refilled

## 2023-12-31 ENCOUNTER — Ambulatory Visit
Admission: EM | Admit: 2023-12-31 | Discharge: 2023-12-31 | Disposition: A | Discharge status: Home / Self Care | Attending: Family Medicine | Admitting: Family Medicine

## 2023-12-31 ENCOUNTER — Ambulatory Visit (INDEPENDENT_AMBULATORY_CARE_PROVIDER_SITE_OTHER)

## 2023-12-31 DIAGNOSIS — S52501A Unspecified fracture of the lower end of right radius, initial encounter for closed fracture: Secondary | ICD-10-CM | POA: Diagnosis not present

## 2023-12-31 DIAGNOSIS — W19XXXA Unspecified fall, initial encounter: Secondary | ICD-10-CM | POA: Diagnosis not present

## 2023-12-31 DIAGNOSIS — M1811 Unilateral primary osteoarthritis of first carpometacarpal joint, right hand: Secondary | ICD-10-CM | POA: Diagnosis not present

## 2023-12-31 DIAGNOSIS — S52571A Other intraarticular fracture of lower end of right radius, initial encounter for closed fracture: Secondary | ICD-10-CM | POA: Diagnosis not present

## 2023-12-31 NOTE — Discharge Instructions (Signed)
 Call the orthopedist to schedule follow-up, we have immobilized the area with a splint and you may keep this on until the orthopedist can give you further instructions.  Elevate at rest to help with swelling and take Tylenol as needed.  Follow-up for any worsening symptoms such as hand discoloration, numbness, tingling, loss of range of motion in the fingers

## 2023-12-31 NOTE — ED Triage Notes (Signed)
 Pt reports she was trying to step up on pavement and didn't step high enough and fell on her right arm/ wrist. Pt states the area is swollen and bruised. Pt has limited ROM in right wrist. Pt also hit her face on the pavement.    Took tylenol

## 2023-12-31 NOTE — ED Provider Notes (Signed)
 [facility]    CSN: [id number] Arrival date & time: 12/31/23  4098      History   Chief Complaint Chief Complaint  Patient presents with   Fall    HPI Danielle Vasquez is a 89 y.o. female.   Patient presenting today with right wrist pain, bruising, swelling after a fall 2 days ago where she caught herself with her right hand.  She denies loss of range of motion but does have significant pain with range of motion particularly to the radial aspect of the wrist.  Denies skin injury, numbness, tingling, loss of range of motion of the fingers, and denies injury elsewhere including head injury.  So far trying Tylenol with mild temporary benefit pain.  Does take Eliquis  daily.    Past Medical History:  Diagnosis Date   Dysrhythmia    fast heart beat"   Fibromyalgia    GERD (gastroesophageal reflux disease)    Glaucoma    HTN (hypertension)    Hyperlipemia     Patient Active Problem List   Diagnosis Date Noted   Pes anserinus bursitis of both knees 01/16/2012    Past Surgical History:  Procedure Laterality Date   CATARACT EXTRACTION W/PHACO  09/01/2012   Procedure: CATARACT EXTRACTION PHACO AND INTRAOCULAR LENS PLACEMENT (IOC);  Surgeon: Danielle Cummins, MD;  Location: [facility];  Service: Ophthalmology;  Laterality: Right;  CDE=11.36   CATARACT EXTRACTION W/PHACO Left 11/03/2012   Procedure: CATARACT EXTRACTION PHACO AND INTRAOCULAR LENS PLACEMENT (IOC);  Surgeon: Danielle Cummins, MD;  Location: [facility];  Service: Ophthalmology;  Laterality: Left;  CDE:  13.74   CHOLECYSTECTOMY     DILATION AND CURETTAGE OF UTERUS     YAG LASER APPLICATION Right 05/28/2016   Procedure: YAG LASER APPLICATION;  Surgeon: Danielle Cummins, MD;  Location: [facility];  Service: Ophthalmology;  Laterality: Right;    OB History   No obstetric history on file.      Home Medications    Prior to Admission medications   Medication Sig Start Date End Date Taking? Authorizing Provider   atorvastatin  (LIPITOR) 20 MG tablet Take 1 tablet (20 mg total) by mouth daily. Patient not taking: Reported on 03/25/2023 12/13/22   Danielle Haggard, MD  Cholecalciferol (VITAMIN D) 1000 UNITS capsule Take 1,000 Units by mouth daily.    [provider]  ELIQUIS  2.5 MG TABS tablet TAKE 1 TABLET TWICE A DAY 10/28/23   Danielle Haggard, MD  ezetimibe (ZETIA) 10 MG tablet Take 10 mg by mouth daily.    [provider]  hydrochlorothiazide  (HYDRODIURIL ) 25 MG tablet Take 1 tablet (25 mg total) by mouth daily. Patient not taking: Reported on 03/25/2023 12/13/22   Danielle Haggard, MD  lisinopril (PRINIVIL,ZESTRIL) 20 MG tablet Take 20 mg by mouth daily.    [provider]  metoprolol  tartrate (LOPRESSOR ) 25 MG tablet daily at 6 (six) AM. 11/18/22   [provider]  Omega-3 Fatty Acids (FISH OIL) 1200 MG CAPS Take 1,200 mg by mouth daily.     [provider]  pantoprazole (PROTONIX) 20 MG tablet Take 20 mg by mouth every morning. 02/17/18   [provider]    Family History Family History  Problem Relation Age of Onset   Heart disease Unknown    Arthritis Unknown     Social History Social History   Tobacco Use   Smoking status: Never   Smokeless tobacco: Never  Vaping Use   Vaping  status: Never Used  Substance Use Topics   Alcohol use: No   Drug use: No     Allergies   Patient has no known allergies.   Review of Systems Review of Systems Per HPI  Physical Exam Triage Vital Signs ED Triage Vitals [12/31/23 0924]  Encounter Vitals Group     BP (!) 175/95     Systolic BP Percentile      Diastolic BP Percentile      Pulse Rate 73     Resp 16     Temp 98 F (36.7 C)     Temp Source Oral     SpO2 96 %     Weight      Height      Head Circumference      Peak Flow      Pain Score 5     Pain Loc      Pain Education      Exclude from Growth Chart    No data found.  Updated Vital Signs BP (!) 175/95 (BP Location: Right Arm)    Pulse 73   Temp 98 F (36.7 C) (Oral)   Resp 16   SpO2 96%   Visual Acuity Right Eye Distance:   Left Eye Distance:   Bilateral Distance:    Right Eye Near:   Left Eye Near:    Bilateral Near:     Physical Exam Vitals and nursing note reviewed.  Constitutional:      Appearance: Normal appearance. She is not ill-appearing.  HENT:     Head: Atraumatic.  Eyes:     Extraocular Movements: Extraocular movements intact.     Conjunctiva/sclera: Conjunctivae normal.  Cardiovascular:     Rate and Rhythm: Normal rate.  Pulmonary:     Effort: Pulmonary effort is normal.  Musculoskeletal:        General: Swelling, tenderness and signs of injury present. No deformity. Normal range of motion.     Cervical back: Normal range of motion and neck supple.     Comments: Tenderness to palpation to the right wrist particular in the radial aspect with localized edema to this area.  Bruising diffusely across right wrist and to mid forearm.  Range of motion of the right wrist mildly guarded, range of motion of all 5 fingers of the right hand intact  Skin:    General: Skin is warm and dry.     Findings: Bruising present.  Neurological:     Mental Status: She is alert. Mental status is at baseline.     Comments: Right upper extremity neurovascularly intact  Psychiatric:        Mood and Affect: Mood normal.        Thought Content: Thought content normal.        Judgment: Judgment normal.      UC Treatments / Results  Labs (all labs ordered are listed, but only abnormal results are displayed) Labs Reviewed - No data to display  EKG   Radiology DG Wrist Complete Right Result Date: 12/31/2023 EXAM: 3 or more VIEW(S) XRAY OF THE RIGHT WRIST 12/31/2023 09:40:40 AM COMPARISON: None available. CLINICAL HISTORY: Danielle Vasquez on pavement. FINDINGS: BONES AND JOINTS: Non-displaced intraarticular fracture radial aspect of the distal radius metaepiphysis. No additional fracture. No dislocation.  Chondrocalcinosis throughout the right wrist joint. Severe first carpometacarpal joint osteoarthritis. SOFT TISSUES: Surrounding soft tissue swelling. IMPRESSION: 1. Non-displaced intraarticular fracture of the radial aspect of the distal radius metaepiphysis with surrounding soft tissue swelling.  2. Chondrocalcinosis throughout the right wrist joint, indicative of CPPD. 3. Severe first carpometacarpal joint osteoarthritis. Electronically signed by: Karlyn Overman MD 12/31/2023 10:37 AM EDT RP Workstation: [id number]    Procedures Procedures (including critical care time)  Medications Ordered in UC Medications - No data to display  Initial Impression / Assessment and Plan / UC Course  I have reviewed the triage vital signs and the nursing notes.  Pertinent labs & imaging results that were available during my care of the patient were reviewed by me and considered in my medical decision making (see chart for details).     X-ray of the right wrist today showing a distal radial fracture.  Radial gutter splint placed and discussed orthopedic follow-up, elevation, Tylenol as needed.  Return for worsening symptoms.  Final Clinical Impressions(s) / UC Diagnoses   Final diagnoses:  Closed fracture of distal end of right radius, unspecified fracture morphology, initial encounter  Fall, initial encounter     Discharge Instructions      Call the orthopedist to schedule follow-up, we have immobilized the area with a splint and you may keep this on until the orthopedist can give you further instructions.  Elevate at rest to help with swelling and take Tylenol as needed.  Follow-up for any worsening symptoms such as hand discoloration, numbness, tingling, loss of range of motion in the fingers    ED Prescriptions   None    PDMP not reviewed this encounter.   Corbin Dess, [place] 12/31/23 1141

## 2024-01-01 ENCOUNTER — Ambulatory Visit (INDEPENDENT_AMBULATORY_CARE_PROVIDER_SITE_OTHER): Admitting: Orthopedic Surgery

## 2024-01-01 ENCOUNTER — Encounter: Payer: Self-pay | Admitting: Orthopedic Surgery

## 2024-01-01 DIAGNOSIS — S52591A Other fractures of lower end of right radius, initial encounter for closed fracture: Secondary | ICD-10-CM | POA: Diagnosis not present

## 2024-01-01 NOTE — Patient Instructions (Signed)

## 2024-01-01 NOTE — Progress Notes (Addendum)
 New Patient Visit  Assessment: Danielle Vasquez is a 89 y.o. female with the following: Right distal radius fracture; intra-articular split  Plan: KIMBLY KEVILLE fell and sustained a right distal radius fracture.  Minimal displacement.  There does appear to be some impaction, with mild intra-articular step-off.  There is a vertical split of the joint surface.  Minimal displacement overall.  Will plan for nonoperative management.  She was placed in a short arm cast today.  Elevate the hand to help with swelling.  Medications as needed.   Cast application - Right short arm cast   Verbal consent was obtained and the correct extremity was identified. A well padded, appropriately molded short arm cast was applied to the Right arm Fingers remained warm and well perfused.   There were no sharp edges Patient tolerated the procedure well Cast care instructions were provided    Follow-up: Return in about 2 weeks (around 01/15/2024).  Subjective:  Chief Complaint  Patient presents with   Wrist Pain    R wrist pain s/p fall 12/29/23.    History of Present Illness: Danielle Vasquez is a 89 y.o. female who presents for evaluation of right wrist pain.  She is right-hand dominant.  Just couple days ago, she stepped off a curb.  Her foot got caught.  She landed on grass.  She is unaware of how she fell.  She had immediate pain.  She was seen in the emergency department.  Radiographs demonstrate a distal radius fracture.  She was placed in a splint.  She has been taking Tylenol.  No numbness or tingling.   Review of Systems: No fevers or chills No numbness or tingling No chest pain No shortness of breath No bowel or bladder dysfunction No GI distress No headaches   Medical History:  Past Medical History:  Diagnosis Date   Dysrhythmia    fast heart beat"   Fibromyalgia    GERD (gastroesophageal reflux disease)    Glaucoma    HTN (hypertension)    Hyperlipemia     Past  Surgical History:  Procedure Laterality Date   CATARACT EXTRACTION W/PHACO  09/01/2012   Procedure: CATARACT EXTRACTION PHACO AND INTRAOCULAR LENS PLACEMENT (IOC);  Surgeon: Clay Cummins, MD;  Location: [facility];  Service: Ophthalmology;  Laterality: Right;  CDE=11.36   CATARACT EXTRACTION W/PHACO Left 11/03/2012   Procedure: CATARACT EXTRACTION PHACO AND INTRAOCULAR LENS PLACEMENT (IOC);  Surgeon: Clay Cummins, MD;  Location: [facility];  Service: Ophthalmology;  Laterality: Left;  CDE:  13.74   CHOLECYSTECTOMY     DILATION AND CURETTAGE OF UTERUS     YAG LASER APPLICATION Right 05/28/2016   Procedure: YAG LASER APPLICATION;  Surgeon: Clay Cummins, MD;  Location: [facility];  Service: Ophthalmology;  Laterality: Right;    Family History  Problem Relation Age of Onset   Heart disease Unknown    Arthritis Unknown    Social History   Tobacco Use   Smoking status: Never   Smokeless tobacco: Never  Vaping Use   Vaping status: Never Used  Substance Use Topics   Alcohol use: No   Drug use: No    No Known Allergies  Current Meds  Medication Sig   atorvastatin  (LIPITOR) 20 MG tablet Take 1 tablet (20 mg total) by mouth daily.   Cholecalciferol (VITAMIN D) 1000 UNITS capsule Take 1,000 Units by mouth daily.   ELIQUIS  2.5 MG TABS tablet TAKE 1 TABLET TWICE A DAY   ezetimibe (  ZETIA) 10 MG tablet Take 10 mg by mouth daily.   hydrochlorothiazide  (HYDRODIURIL ) 25 MG tablet Take 1 tablet (25 mg total) by mouth daily.   lisinopril (PRINIVIL,ZESTRIL) 20 MG tablet Take 20 mg by mouth daily.   metoprolol  tartrate (LOPRESSOR ) 25 MG tablet daily at 6 (six) AM.   Omega-3 Fatty Acids (FISH OIL) 1200 MG CAPS Take 1,200 mg by mouth daily.    pantoprazole (PROTONIX) 20 MG tablet Take 20 mg by mouth every morning.    Objective: There were no vitals taken for this visit.  Physical Exam:  General: Alert and oriented. and No acute distress. Gait: Normal gait.  Evaluation the right wrist  demonstrates bruising and swelling.  Fingers are warm and well-perfused.  Sensation is intact throughout the right hand.    IMAGING: I personally reviewed images previously obtained from the ED   X-rays from emergency department were available in clinic today.  Intra-articular split with mild intra-articular step-off.  No additional injuries.  New Medications:  No orders of the defined types were placed in this encounter.     Tonita Frater, MD  01/01/2024 3:20 PM

## 2024-01-15 ENCOUNTER — Ambulatory Visit: Admitting: Orthopedic Surgery

## 2024-01-15 ENCOUNTER — Encounter: Payer: Self-pay | Admitting: Orthopedic Surgery

## 2024-01-15 ENCOUNTER — Other Ambulatory Visit (INDEPENDENT_AMBULATORY_CARE_PROVIDER_SITE_OTHER): Payer: Self-pay

## 2024-01-15 DIAGNOSIS — S52591D Other fractures of lower end of right radius, subsequent encounter for closed fracture with routine healing: Secondary | ICD-10-CM

## 2024-01-15 DIAGNOSIS — S52591A Other fractures of lower end of right radius, initial encounter for closed fracture: Secondary | ICD-10-CM

## 2024-01-15 NOTE — Progress Notes (Addendum)
 Return patient Visit  Assessment: Danielle Vasquez is a 89 y.o. female with the following: Right distal radius fracture; intra-articular split  Plan: Danielle Vasquez fell and sustained a right distal radius fracture.  There is some intra-articular involvement, without obvious step-off.  There appears to be some impaction.  Radiographs are stable.  Injury was 2 weeks ago.  I would like to immobilize her for an additional 2 weeks.  Cast was applied in clinic today.  I will see her in 2 weeks.  Anticipate transition to a brace at that time.  Cast application - Right short arm cast   Verbal consent was obtained and the correct extremity was identified. A well padded, appropriately molded short arm cast was applied to the Right arm Fingers remained warm and well perfused.   There were no sharp edges Patient tolerated the procedure well Cast care instructions were provided    Follow-up: Return in about 2 weeks (around 01/29/2024).  Subjective:  Chief Complaint  Patient presents with   Wrist Injury    Right     History of Present Illness: Danielle Vasquez is a 89 y.o. female who returns for evaluation of right wrist pain.  She fell, approximately 2 weeks ago, injured her right wrist.  She has been in a cast.  She has tolerated this well.  She has not taken Tylenol in over a week.  No numbness or tingling.  She is doing quite well with range of motion.   Review of Systems: No fevers or chills No numbness or tingling No chest pain No shortness of breath No bowel or bladder dysfunction No GI distress No headaches    Objective: There were no vitals taken for this visit.  Physical Exam:  General: Alert and oriented. and No acute distress. Gait: Normal gait.  Right hand and wrist with minimal deformity.  Minimal swelling is appreciated.  She is almost able to make a full fist.  Fingers are warm and well-perfused.  She tolerates gentle range of motion of the  wrist.  IMAGING: I personally ordered and reviewed the following images   X-rays of the right wrist were obtained in clinic today.  These are compared to prior x-rays.  Intra-articular split, without further subsidence.  Joint line remains stable.  Minimal step-off is appreciated.  There is some mild impaction.  No additional injuries.  No bony lesions.  Impression: Stable right distal radius fracture  New Medications:  No orders of the defined types were placed in this encounter.     Tonita Frater, MD  01/15/2024 2:46 PM

## 2024-01-15 NOTE — Patient Instructions (Signed)

## 2024-01-29 ENCOUNTER — Other Ambulatory Visit (INDEPENDENT_AMBULATORY_CARE_PROVIDER_SITE_OTHER): Payer: Self-pay

## 2024-01-29 ENCOUNTER — Encounter: Payer: Self-pay | Admitting: Orthopedic Surgery

## 2024-01-29 ENCOUNTER — Ambulatory Visit: Admitting: Orthopedic Surgery

## 2024-01-29 DIAGNOSIS — S52591D Other fractures of lower end of right radius, subsequent encounter for closed fracture with routine healing: Secondary | ICD-10-CM | POA: Diagnosis not present

## 2024-01-29 NOTE — Progress Notes (Signed)
 Return patient Visit  Assessment: Danielle Vasquez is a 89 y.o. female with the following: Right distal radius fracture; intra-articular split  Plan: [name] fell and sustained a right distal radius fracture.  Injury involves some impaction, with an intra-articular split.  Minimal step-off.  On exam, she has no swelling.  No tenderness to palpation.  She tolerates gentle range of motion.  She is not a make a full fist at this time, but she is able to move all of her fingers.  We will transition her to a wrist brace today.  She is to wear the brace at all times for the next 2 weeks.  Okay to remove for hygiene and exercises.  In 2 weeks, she will transition out of the brace.  Follow-up in 1 month.   Follow-up: Return in about 4 weeks (around 02/26/2024).  Subjective:  Chief Complaint  Patient presents with   Routine Post Op    Right Wrist fracture- DOI 12/31/23 doing good ready to get cast off    History of Present Illness: Danielle Vasquez is a 89 y.o. female who returns for evaluation of right wrist pain.  She fell and sustained a right distal radius fracture, a little over a month ago.  She has been immobilized in a cast since.  She is not complaining of pain.  She has some restricted motion in her fingers.  She is ready to have the cast removed.     Review of Systems: No fevers or chills No numbness or tingling No chest pain No shortness of breath No bowel or bladder dysfunction No GI distress No headaches    Objective: There were no vitals taken for this visit.  Physical Exam:  General: Alert and oriented. and No acute distress. Gait: Normal gait.  Evaluation of the right wrist demonstrates no swelling.  No bruising.  Minimal tenderness to palpation along the radial styloid.  She tolerates gentle range of motion of the wrist.  Not quite able to make a full fist.  Mild skin irritation in the first webspace.  No skin breakdown.  Fingers are warm and  well-perfused.  IMAGING: I personally ordered and reviewed the following images   X-rays of the right wrist were obtained in clinic today.  These are compared to available x-rays.  There is impaction of the radial styloid, with an intra-articular vertical split.  There is no step-off within the joint.  No bony lesions.  There is been no interval displacement.  There has been some interval consolidation.  Impression: Stable right distal radius fracture, with intra-articular split but no step-off  New Medications:  No orders of the defined types were placed in this encounter.     Tonita Frater, MD  01/29/2024 9:52 AM

## 2024-01-29 NOTE — Patient Instructions (Signed)
Wrist Fracture Rehab Ask your health care provider which exercises are safe for you. Do exercises exactly as told by your health care provider and adjust them as directed. It is normal to feel mild stretching, pulling, tightness, or discomfort as you do these exercises. Stop right away if you feel sudden pain or your pain gets worse. Do not begin these exercises until told by your health care provider. Stretching and range-of-motion exercises These exercises warm up your muscles and joints and improve the movement and flexibility of your wrist and hand. These exercises also help to relieve pain,numbness, and tingling. Finger flexion and extension Sit or stand with your elbow at your side. Open and stretch your left / right fingers as wide as you can (extension). Hold this position for 10 seconds. Close your left / right fingers into a gentle fist (flexion). Hold this position for 10 seconds. Slowly return to the starting position. Repeat 10 times. Complete this exercise 1-2 times a day. Wrist flexion Bend your left / right elbow to a 90-degree angle (right angle) with your palm facing the floor. Bend your wrist forward so your fingers point toward the floor (flexion). Hold this position for 10 seconds. Slowly return to the starting position. Repeat 10 times. Complete this exercise 1-2 times a day. Wrist extension Bend your left / right elbow to a 90-degree angle (right angle) with your palm facing the floor. Bend your wrist backward so your fingers point toward the ceiling (extension). Hold this position for 10 seconds. Slowly return to the starting position. Repeat 10 times. Complete this exercise 1-2 times a day. Ulnar deviation Bend your left / right elbow to a 90-degree angle (right angle), and rest your forearm on a table with your palm facing down. Keeping your hand flat on the table, bend your left / right wrist toward your small finger (pinkie). This is ulnar deviation. Hold this  position for 10 seconds. Slowly return to the starting position. Repeat 10 times. Complete this exercise 1-2 times a day. Radial deviation Bend your left / right elbow to a 90-degree angle (right angle), and rest your forearm on a table with your palm facing down. Keeping your hand flat on the table, bend your left / right wrist toward your thumb. This is radial deviation. Hold this position for 10 seconds. Slowly return to the starting position. Repeat 10 times. Complete this exercise 1-2 times a day. Forearm rotation, supination Stand or sit with your left / right elbow bent to a 90-degree angle (right angle) at your side. Position your forearm so that the thumb is facing the ceiling (neutral position). Turn (rotate) your palm up toward the ceiling (supination), stopping when you feel a gentle stretch. Hold this position for 10 seconds. Slowly return to the starting position. Repeat 10 times. Complete this exercise 1-2 times a day. Forearm rotation, pronation Stand or sit with your left / right elbow bent to a 90-degree angle (right angle) at your side. Position your forearm so that the thumb is facing the ceiling (neutral position). Turn (rotate) your palm down toward the floor (pronation), stopping when you feel a gentle stretch. Hold this position for 10 seconds. Slowly return to the starting position. Repeat 10 times. Complete this exercise 1-2 times a day. Wrist flexion stretch  Extend your left / right arm in front of you and turn your palm down toward the floor. If told by your health care provider, bend your left / right arm to a 90-degree angle (  right angle) at your side. Using your uninjured hand, gently press over the back of your left / right hand to bend your wrist and fingers toward the floor (flexion). Go as far as you can to feel a stretch without causing pain. Hold this position for 10 seconds. Slowly return to the starting position. Repeat 10 times. Complete this  exercise 1-2 times a day. Wrist extension stretch  Extend your left / right arm in front of you and turn your palm up toward the ceiling. If told by your health care provider, bend your left / right arm to a 90-degree angle (right angle) at your side. Using your uninjured hand, gently press over the palm of your left / right hand to bend your wrist and fingers toward the floor (extension). Go as far as you can to feel a stretch without causing pain. Hold this position for 10 seconds. Slowly return to the starting position. Repeat 10 times. Complete this exercise 1-2 times a day. Forearm rotation stretch, supination Stand or sit with your arms at your sides. Bend your left / right elbow to a 90-degree angle (right angle). Using your uninjured hand, turn your left / right palm up toward the ceiling (assisted supination) until you feel a gentle stretch in the inside of your forearm. Hold this position for 10 seconds. Slowly return to the starting position. Repeat 10 times. Complete this exercise 1-2 times a day. Forearm rotation stretch, pronation Stand or sit with your arms at your sides. Bend your left / right elbow to a 90-degree angle (right angle). Using your uninjured hand, turn your left / right palm down toward the floor (assisted pronation) until you feel a gentle stretch in the top of your forearm. Hold this position for 10 seconds. Slowly return to the starting position. Repeat 10 times. Complete this exercise 1-2 times a day. Strengthening exercises These exercises build strength and endurance in your wrist and hand. Enduranceis the ability to use your muscles for a long time, even after they get tired. Wrist flexion Sit with your left / right forearm supported on a table. Your elbow should be at waist height. Rest your hand over the edge of the table, palm up. Gently grasp a 5 lb / kg weight (can of soup). Or, hold an exercise band or tube in both hands, keeping your hands at  the same level and hip distance apart. There should be slight tension in the exercise band or tube. Without moving your forearm or elbow, slowly bend your wrist up toward the ceiling (wrist flexion). Hold this position for 10 seconds. Slowly return to the starting position. Repeat 10 times. Complete this exercise 1-2 times a day. Wrist extension Sit with your left / right forearm supported on a table. Your elbow should be at waist height. Rest your hand over the edge of the table, palm down. Gently grasp a 5 lb / kg weight. Or, hold an exercise band or tube in both hands, keeping your hands at the same level and hip distance apart. There should be slight tension in the exercise band or tube. Without moving your forearm or elbow, slowly curl your hand up toward the ceiling (extension). Hold this position for 10 seconds. Slowly return to the starting position. Repeat 10 times. Complete this exercise 1-2 times a day. Forearm rotation, supination  Sit with your left / right forearm supported on a table. Your elbow should be at waist height. Rest your hand over the edge of the  table, palm down. Gently grasp a lightweight hammer near the head. As this exercise gets easier for you, try holding the hammer farther down the handle. Without moving your elbow, slowly turn (rotate) your palm up toward the ceiling (supination). Hold this position for 10 seconds. Slowly return to the starting position. Repeat 10 times. Complete this exercise 1-2 times a day. Forearm rotation, pronation  Sit with your left / right forearm supported on a table. Your elbow should be at waist height. Rest your hand over the edge of the table, palm up. Gently grasp a lightweight hammer near the head. As this exercise gets easier for you, try holding the hammer farther down the handle. Without moving your elbow, slowly turn (rotate) your palm down toward the floor (pronation). Hold this position for 10 seconds. Slowly return  to the starting position. Repeat 10 times. Complete this exercise 1-2 times a day. Grip strengthening  Hold one of these items in your left / right hand: a dense sponge, a stress ball, or a large, rolled sock. Slowly squeeze the object as hard as you can without increasing any pain. Hold your squeeze for 10 seconds. Slowly release your grip. Repeat 10 times. Complete this exercise 1-2 times a day. This information is not intended to replace advice given to you by your health care provider. Make sure you discuss any questions you have with your healthcare provider. Document Revised: 12/31/2019 Document Reviewed: 12/31/2019 Elsevier Patient Education  [employer].

## 2024-02-22 NOTE — Progress Notes (Signed)
 Cardiology Office Note   Date:  02/28/2024   ID:  Danielle Vasquez, DOB [birth year], MRN [id number]  PCP:  Shona Norleen PEDLAR, MD  Cardiologist:   Vina Gull, MD   Patient presents for f/u of HTN and PAF    History of Present Illness: Danielle Vasquez is a 89 y.o. female with a history of HTN and PAF   I saw the pt in July 2024  Since seen he denies palpitations   No SOB   No CP  No dizziness    Current Meds  Medication Sig   amLODipine (NORVASC) 2.5 MG tablet Take 1 tablet (2.5 mg total) by mouth daily.   atorvastatin  (LIPITOR) 20 MG tablet Take 1 tablet (20 mg total) by mouth daily.   Cholecalciferol (VITAMIN D) 1000 UNITS capsule Take 1,000 Units by mouth daily.   ELIQUIS  2.5 MG TABS tablet TAKE 1 TABLET TWICE A DAY   ezetimibe (ZETIA) 10 MG tablet Take 10 mg by mouth daily.   hydrochlorothiazide  (HYDRODIURIL ) 25 MG tablet Take 1 tablet (25 mg total) by mouth daily.   lisinopril (PRINIVIL,ZESTRIL) 20 MG tablet Take 20 mg by mouth daily.   metoprolol  tartrate (LOPRESSOR ) 25 MG tablet daily at 6 (six) AM.   Omega-3 Fatty Acids (FISH OIL) 1200 MG CAPS Take 1,200 mg by mouth daily.    pantoprazole (PROTONIX) 20 MG tablet Take 20 mg by mouth every morning.     Allergies:   Patient has no known allergies.   Past Medical History:  Diagnosis Date   Dysrhythmia    fast heart beat   Fibromyalgia    GERD (gastroesophageal reflux disease)    Glaucoma    HTN (hypertension)    Hyperlipemia     Past Surgical History:  Procedure Laterality Date   CATARACT EXTRACTION W/PHACO  09/01/2012   Procedure: CATARACT EXTRACTION PHACO AND INTRAOCULAR LENS PLACEMENT (IOC);  Surgeon: Dow JULIANNA Burke, MD;  Location: [facility];  Service: Ophthalmology;  Laterality: Right;  CDE=11.36   CATARACT EXTRACTION W/PHACO Left 11/03/2012   Procedure: CATARACT EXTRACTION PHACO AND INTRAOCULAR LENS PLACEMENT (IOC);  Surgeon: Dow JULIANNA Burke, MD;  Location: [facility];  Service: Ophthalmology;  Laterality:  Left;  CDE:  13.74   CHOLECYSTECTOMY     DILATION AND CURETTAGE OF UTERUS     YAG LASER APPLICATION Right 05/28/2016   Procedure: YAG LASER APPLICATION;  Surgeon: Dow JULIANNA Burke, MD;  Location: [facility];  Service: Ophthalmology;  Laterality: Right;     Social History:  The patient  reports that she has never smoked. She has never used smokeless tobacco. She reports that she does not drink alcohol and does not use drugs.   Family History:  The patient's family history includes Arthritis in her unknown relative; Heart disease in her unknown relative.    ROS:  Please see the history of present illness. All other systems are reviewed and  Negative to the above problem except as noted.    PHYSICAL EXAM: VS:  BP (!) 178/80 (BP Location: Left Arm)   Pulse (!) 58   Ht 5' 1 (1.549 m)   Wt 119 lb 12.8 oz (54.3 kg)   SpO2 95%   BMI 22.64 kg/m   GEN:  Thin 89 yo  in no acute distress  HEENT: normal  Neck: no JVD   No bruits   Cardiac: RRR; no murmurs  No  LE edema  Respiratory:  clear to auscultation  GI: soft, nontender, nondistended  EKG:  EKG   SB 58    Incomp RBBB   Lipid Panel    Component Value Date/Time   CHOL 238 (H) 03/13/2021 0905   CHOL 185 08/15/2017 1023   TRIG 72 03/13/2021 0905   HDL 67 03/13/2021 0905   HDL 62 08/15/2017 1023   CHOLHDL 3.6 03/13/2021 0905   VLDL 14 03/13/2021 0905   LDLCALC 157 (H) 03/13/2021 0905   LDLCALC 104 (H) 08/15/2017 1023      Wt Readings from Last 3 Encounters:  02/28/24 119 lb 12.8 oz (54.3 kg)  03/25/23 117 lb (53.1 kg)  12/13/22 116 lb (52.6 kg)      ASSESSMENT AND PLAN:  1  PAF  Pt in SR today  Denies palpitaitons   Continue Eliquis   2  HTN   BP is very high   She says she is taking her meds      I would add 2.5 mg amlodipine to regimen   Continue other meds  Follow up for BP check in 4 wks      3  LIpids  Pt on 20 atorvastatin  and 10 mg Eliquis      LDL is higher than last visit   162  WUestion if she is taking  meds  Overall need to check on patient compliance   Find out if filling Rxs  Espcieally since she is on Eliquis     WIll check     Signed, Vina Gull, MD  02/28/2024 3:00 PM    [facility] [address] [place], [place], [place]  [address] Phone: [phone]; Fax: [phone]

## 2024-02-26 ENCOUNTER — Ambulatory Visit (INDEPENDENT_AMBULATORY_CARE_PROVIDER_SITE_OTHER): Admitting: Orthopedic Surgery

## 2024-02-26 ENCOUNTER — Other Ambulatory Visit (INDEPENDENT_AMBULATORY_CARE_PROVIDER_SITE_OTHER): Payer: Self-pay

## 2024-02-26 ENCOUNTER — Encounter: Payer: Self-pay | Admitting: Orthopedic Surgery

## 2024-02-26 DIAGNOSIS — S52591D Other fractures of lower end of right radius, subsequent encounter for closed fracture with routine healing: Secondary | ICD-10-CM

## 2024-02-26 NOTE — Progress Notes (Signed)
 Return patient Visit  Assessment: Danielle Vasquez is a 89 y.o. female with the following: Right distal radius fracture; intra-articular split  Plan: Danielle Vasquez fell and sustained a right distal radius fracture.  Injury was over 2 months ago.  She is doing quite well.  Radiographs are stable.  She is using the brace occasionally, typically when she is doing a lot of work around the house or in the garden.  I think this is reasonable.  Continue to work on motion.  Medicines as needed.  She will return to clinic as needed.  Follow-up: Return if symptoms worsen or fail to improve.  Subjective:  Chief Complaint  Patient presents with   Wrist Injury    Right /improving wearing splint at times     History of Present Illness: Danielle Vasquez is a 89 y.o. female who returns for evaluation of right wrist pain.  She fell and sustained a right distal radius fracture, over 2 months ago.  She has now transitioned out of the brace.  She states that she is using the brace once per week.  She will use it if she is doing a lot of housework, or working in her garden.  No numbness or tingling.  Her pain is controlled.  She is happy with the results.   Review of Systems: No fevers or chills No numbness or tingling No chest pain No shortness of breath No bowel or bladder dysfunction No GI distress No headaches    Objective: There were no vitals taken for this visit.  Physical Exam:  General: Alert and oriented. and No acute distress. Gait: Normal gait.  Right wrist with minimal swelling.  No bruising.  No tenderness to palpation of the radial styloid.  She tolerates flexion and extension, as well as radial and ulnar deviation.  She has arthritis throughout all fingers, which limits her ability to make a tight fist, but her motion is at her baseline.  Fingers are warm well-perfused. IMAGING: I personally ordered and reviewed the following images   X-rays of the right wrist were  obtained in clinic today.  These are compared to prior x-rays.  Radial styloid fracture, with intra-articular split with mild impaction.  There is no obvious step-off.  There has been interval consolidation.  No further displacement.  No bony lesions.  Diffuse degenerative changes throughout all fingers.  Impression: Stable right intra-articular, distal radius fracture  New Medications:  No orders of the defined types were placed in this encounter.     Oneil DELENA Horde, MD  02/26/2024 1:41 PM

## 2024-02-28 ENCOUNTER — Ambulatory Visit: Attending: Internal Medicine | Admitting: Internal Medicine

## 2024-02-28 ENCOUNTER — Encounter: Payer: Self-pay | Admitting: Internal Medicine

## 2024-02-28 VITALS — BP 178/80 | HR 58 | Ht 61.0 in | Wt 119.8 lb

## 2024-02-28 DIAGNOSIS — I480 Paroxysmal atrial fibrillation: Secondary | ICD-10-CM | POA: Diagnosis not present

## 2024-02-28 MED ORDER — AMLODIPINE BESYLATE 2.5 MG PO TABS: 2.5000 mg | ORAL_TABLET | Freq: Every day | ORAL | 3 refills | Status: DC

## 2024-02-28 NOTE — Patient Instructions (Signed)
 Medication Instructions:   START Amlodipine 2.5 mg daily for blood pressure  Labwork: None today  Testing/Procedures: None today  Follow-Up:  Nurse visit for BP check in 1 month  6 months Dr.Ross  Any Other Special Instructions Will Be Listed Below (If Applicable).  If you need a refill on your cardiac medications before your next appointment, please call your pharmacy.

## 2024-03-02 NOTE — Progress Notes (Signed)
 With BP at that level, I would increase amlodipine to 5 mg daily   Pt should still have follow up as planned  I increased atorvastatin  in APril 2024   I don't see that I stopped it

## 2024-03-04 ENCOUNTER — Telehealth: Payer: Self-pay

## 2024-03-04 MED ORDER — AMLODIPINE BESYLATE 5 MG PO TABS: 5.0000 mg | ORAL_TABLET | Freq: Every day | ORAL | 3 refills | Status: DC

## 2024-03-04 NOTE — Telephone Encounter (Signed)
  With BP at that level, I would increase amlodipine to 5 mg daily   Pt should still have follow up as planned  I increased atorvastatin  in APril 2024   I don't see that I stopped it       03/04/24 5 pm  I left message for patient that his amlodipine is increased to 5 mg daily. I will send rx to [facility] and have asked patient to return call in am to confirm.

## 2024-03-10 DIAGNOSIS — E1369 Other specified diabetes mellitus with other specified complication: Secondary | ICD-10-CM | POA: Diagnosis not present

## 2024-03-10 DIAGNOSIS — E782 Mixed hyperlipidemia: Secondary | ICD-10-CM | POA: Diagnosis not present

## 2024-03-11 LAB — LAB REPORT - SCANNED
A1c: 5.7
Albumin, Urine POC: 7.4
Creatinine, POC: 71.4 mg/dL
EGFR: 70
Microalb Creat Ratio: 10

## 2024-03-16 ENCOUNTER — Encounter: Payer: Self-pay | Admitting: Internal Medicine

## 2024-03-16 DIAGNOSIS — Z713 Dietary counseling and surveillance: Secondary | ICD-10-CM | POA: Diagnosis not present

## 2024-03-16 DIAGNOSIS — R944 Abnormal results of kidney function studies: Secondary | ICD-10-CM | POA: Diagnosis not present

## 2024-03-16 DIAGNOSIS — Z79899 Other long term (current) drug therapy: Secondary | ICD-10-CM | POA: Diagnosis not present

## 2024-03-16 DIAGNOSIS — I480 Paroxysmal atrial fibrillation: Secondary | ICD-10-CM | POA: Diagnosis not present

## 2024-03-16 DIAGNOSIS — M179 Osteoarthritis of knee, unspecified: Secondary | ICD-10-CM | POA: Diagnosis not present

## 2024-03-16 DIAGNOSIS — S52501D Unspecified fracture of the lower end of right radius, subsequent encounter for closed fracture with routine healing: Secondary | ICD-10-CM | POA: Diagnosis not present

## 2024-03-16 DIAGNOSIS — E1369 Other specified diabetes mellitus with other specified complication: Secondary | ICD-10-CM | POA: Diagnosis not present

## 2024-03-16 DIAGNOSIS — G720 Drug-induced myopathy: Secondary | ICD-10-CM | POA: Diagnosis not present

## 2024-03-16 DIAGNOSIS — K219 Gastro-esophageal reflux disease without esophagitis: Secondary | ICD-10-CM | POA: Diagnosis not present

## 2024-03-16 DIAGNOSIS — Z7901 Long term (current) use of anticoagulants: Secondary | ICD-10-CM | POA: Diagnosis not present

## 2024-03-16 DIAGNOSIS — I10 Essential (primary) hypertension: Secondary | ICD-10-CM | POA: Diagnosis not present

## 2024-03-16 DIAGNOSIS — E782 Mixed hyperlipidemia: Secondary | ICD-10-CM | POA: Diagnosis not present

## 2024-03-18 ENCOUNTER — Ambulatory Visit: Payer: Self-pay | Admitting: Internal Medicine

## 2024-03-30 ENCOUNTER — Telehealth: Payer: Self-pay | Admitting: *Deleted

## 2024-03-30 ENCOUNTER — Ambulatory Visit: Attending: Internal Medicine | Admitting: *Deleted

## 2024-03-30 VITALS — BP 146/74 | HR 56 | Ht 61.0 in | Wt 121.8 lb

## 2024-03-30 DIAGNOSIS — Z0130 Encounter for examination of blood pressure without abnormal findings: Secondary | ICD-10-CM | POA: Insufficient documentation

## 2024-03-30 NOTE — Telephone Encounter (Signed)
 Per Dr. Okey:   For age, BP is not bad   Goal 120s, 130s   Some 140s Keep up with BP over the next couple months   Can send in readings?   The patient has been notified of the result and verbalized understanding.  All questions (if any) were answered. DOMINIC COLUMBUS, LPN 2/71/7974 4:79 PM

## 2024-03-30 NOTE — Progress Notes (Signed)
 For age, BP is not bad   Goal 120s, 130s   Some 140s Keep up with BP over the next couple months   Can send in readings?

## 2024-03-30 NOTE — Progress Notes (Signed)
 Pt in office for BP check. Pt reports not taking Lipitor, Zetia, hydrochlorothiazide  at this time.

## 2024-05-20 ENCOUNTER — Other Ambulatory Visit: Payer: Self-pay

## 2024-05-20 ENCOUNTER — Encounter (HOSPITAL_COMMUNITY): Payer: Self-pay

## 2024-05-20 ENCOUNTER — Inpatient Hospital Stay (HOSPITAL_COMMUNITY)
Admission: EM | Admit: 2024-05-20 | Discharge: 2024-05-23 | DRG: 378 | Disposition: A | Discharge status: Home / Self Care | Attending: Internal Medicine | Admitting: Internal Medicine

## 2024-05-20 ENCOUNTER — Emergency Department (HOSPITAL_COMMUNITY)

## 2024-05-20 DIAGNOSIS — K559 Vascular disorder of intestine, unspecified: Secondary | ICD-10-CM | POA: Diagnosis not present

## 2024-05-20 DIAGNOSIS — H409 Unspecified glaucoma: Secondary | ICD-10-CM | POA: Diagnosis present

## 2024-05-20 DIAGNOSIS — Z7901 Long term (current) use of anticoagulants: Secondary | ICD-10-CM

## 2024-05-20 DIAGNOSIS — D123 Benign neoplasm of transverse colon: Secondary | ICD-10-CM | POA: Diagnosis not present

## 2024-05-20 DIAGNOSIS — I700 Atherosclerosis of aorta: Secondary | ICD-10-CM | POA: Diagnosis not present

## 2024-05-20 DIAGNOSIS — K649 Unspecified hemorrhoids: Secondary | ICD-10-CM | POA: Diagnosis present

## 2024-05-20 DIAGNOSIS — Z888 Allergy status to other drugs, medicaments and biological substances status: Secondary | ICD-10-CM

## 2024-05-20 DIAGNOSIS — E860 Dehydration: Secondary | ICD-10-CM | POA: Diagnosis present

## 2024-05-20 DIAGNOSIS — I10 Essential (primary) hypertension: Secondary | ICD-10-CM | POA: Diagnosis present

## 2024-05-20 DIAGNOSIS — E785 Hyperlipidemia, unspecified: Secondary | ICD-10-CM | POA: Diagnosis present

## 2024-05-20 DIAGNOSIS — K219 Gastro-esophageal reflux disease without esophagitis: Secondary | ICD-10-CM | POA: Diagnosis present

## 2024-05-20 DIAGNOSIS — K921 Melena: Secondary | ICD-10-CM | POA: Diagnosis present

## 2024-05-20 DIAGNOSIS — K922 Gastrointestinal hemorrhage, unspecified: Principal | ICD-10-CM | POA: Diagnosis present

## 2024-05-20 DIAGNOSIS — K635 Polyp of colon: Secondary | ICD-10-CM | POA: Diagnosis not present

## 2024-05-20 DIAGNOSIS — K625 Hemorrhage of anus and rectum: Secondary | ICD-10-CM | POA: Diagnosis present

## 2024-05-20 DIAGNOSIS — Z79899 Other long term (current) drug therapy: Secondary | ICD-10-CM | POA: Diagnosis not present

## 2024-05-20 DIAGNOSIS — K760 Fatty (change of) liver, not elsewhere classified: Secondary | ICD-10-CM | POA: Diagnosis not present

## 2024-05-20 DIAGNOSIS — N281 Cyst of kidney, acquired: Secondary | ICD-10-CM | POA: Diagnosis not present

## 2024-05-20 DIAGNOSIS — N179 Acute kidney failure, unspecified: Secondary | ICD-10-CM | POA: Diagnosis present

## 2024-05-20 DIAGNOSIS — M797 Fibromyalgia: Secondary | ICD-10-CM | POA: Diagnosis present

## 2024-05-20 DIAGNOSIS — I480 Paroxysmal atrial fibrillation: Secondary | ICD-10-CM | POA: Diagnosis not present

## 2024-05-20 DIAGNOSIS — I499 Cardiac arrhythmia, unspecified: Secondary | ICD-10-CM | POA: Diagnosis not present

## 2024-05-20 DIAGNOSIS — F0390 Unspecified dementia without behavioral disturbance: Secondary | ICD-10-CM | POA: Diagnosis present

## 2024-05-20 DIAGNOSIS — R109 Unspecified abdominal pain: Secondary | ICD-10-CM | POA: Diagnosis not present

## 2024-05-20 DIAGNOSIS — K529 Noninfective gastroenteritis and colitis, unspecified: Secondary | ICD-10-CM | POA: Diagnosis not present

## 2024-05-20 HISTORY — DX: Paroxysmal atrial fibrillation: I48.0

## 2024-05-20 LAB — CBC WITH DIFFERENTIAL/PLATELET
Abs Immature Granulocytes: 0.09 K/uL — ABNORMAL HIGH (ref 0.00–0.07)
Basophils Absolute: 0.1 K/uL (ref 0.0–0.1)
Basophils Relative: 0 %
Eosinophils Absolute: 0 K/uL (ref 0.0–0.5)
Eosinophils Relative: 0 %
HCT: 48.1 % — ABNORMAL HIGH (ref 36.0–46.0)
Hemoglobin: 16.1 g/dL — ABNORMAL HIGH (ref 12.0–15.0)
Immature Granulocytes: 1 %
Lymphocytes Relative: 5 %
Lymphs Abs: 0.9 K/uL (ref 0.7–4.0)
MCH: 31.5 pg (ref 26.0–34.0)
MCHC: 33.5 g/dL (ref 30.0–36.0)
MCV: 94.1 fL (ref 80.0–100.0)
Monocytes Absolute: 0.9 K/uL (ref 0.1–1.0)
Monocytes Relative: 5 %
Neutro Abs: 17.5 K/uL — ABNORMAL HIGH (ref 1.7–7.7)
Neutrophils Relative %: 89 %
Platelets: 195 K/uL (ref 150–400)
RBC: 5.11 MIL/uL (ref 3.87–5.11)
RDW: 12.4 % (ref 11.5–15.5)
WBC: 19.5 K/uL — ABNORMAL HIGH (ref 4.0–10.5)
nRBC: 0 % (ref 0.0–0.2)

## 2024-05-20 LAB — COMPREHENSIVE METABOLIC PANEL WITH GFR
ALT: 18 U/L (ref 0–44)
AST: 30 U/L (ref 15–41)
Albumin: 4.1 g/dL (ref 3.5–5.0)
Alkaline Phosphatase: 71 U/L (ref 38–126)
Anion gap: 14 (ref 5–15)
BUN: 28 mg/dL — ABNORMAL HIGH (ref 8–23)
CO2: 23 mmol/L (ref 22–32)
Calcium: 9.7 mg/dL (ref 8.9–10.3)
Chloride: 99 mmol/L (ref 98–111)
Creatinine, Ser: 1.45 mg/dL — ABNORMAL HIGH (ref 0.44–1.00)
GFR, Estimated: 34 mL/min — ABNORMAL LOW (ref >60)
Glucose, Bld: 177 mg/dL — ABNORMAL HIGH (ref 70–99)
Potassium: 4.8 mmol/L (ref 3.5–5.1)
Sodium: 136 mmol/L (ref 135–145)
Total Bilirubin: 1.1 mg/dL (ref 0.0–1.2)
Total Protein: 6.9 g/dL (ref 6.5–8.1)

## 2024-05-20 LAB — TYPE AND SCREEN
ABO/RH(D): O POS
Antibody Screen: NEGATIVE

## 2024-05-20 LAB — LACTIC ACID, PLASMA
Lactic Acid, Venous: 1.8 mmol/L (ref 0.5–1.9)
Lactic Acid, Venous: 2.6 mmol/L (ref 0.5–1.9)

## 2024-05-20 LAB — HEMOGLOBIN AND HEMATOCRIT, BLOOD
HCT: 43.2 % (ref 36.0–46.0)
Hemoglobin: 14.7 g/dL (ref 12.0–15.0)

## 2024-05-20 MED ORDER — PANTOPRAZOLE SODIUM 40 MG IV SOLR
40.0000 mg | INTRAVENOUS | Status: DC
Start: 2024-05-20 — End: 2024-05-23
  Administered 2024-05-20 – 2024-05-22 (×3): 40 mg via INTRAVENOUS
  Filled 2024-05-20 (×3): qty 10

## 2024-05-20 MED ORDER — ACETAMINOPHEN 650 MG RE SUPP: 650.0000 mg | Freq: Four times a day (QID) | RECTAL | Status: DC | PRN

## 2024-05-20 MED ORDER — ACETAMINOPHEN 325 MG PO TABS: 650.0000 mg | ORAL_TABLET | Freq: Four times a day (QID) | ORAL | Status: DC | PRN

## 2024-05-20 MED ORDER — IOHEXOL 350 MG/ML SOLN
75.0000 mL | Freq: Once | INTRAVENOUS | Status: AC | PRN
Start: 2024-05-20 — End: 2024-05-20
  Administered 2024-05-20: 75 mL via INTRAVENOUS

## 2024-05-20 MED ORDER — SODIUM CHLORIDE 0.9 % IV BOLUS
500.0000 mL | Freq: Once | INTRAVENOUS | Status: AC
  Administered 2024-05-20: 500 mL via INTRAVENOUS

## 2024-05-20 MED ORDER — ONDANSETRON HCL 4 MG/2ML IJ SOLN: 4.0000 mg | Freq: Four times a day (QID) | INTRAMUSCULAR | Status: DC | PRN

## 2024-05-20 MED ORDER — SODIUM CHLORIDE 0.9 % IV SOLN: INTRAVENOUS | Status: DC

## 2024-05-20 MED ORDER — POLYETHYLENE GLYCOL 3350 17 G PO PACK: 17.0000 g | PACK | Freq: Every day | ORAL | Status: DC | PRN

## 2024-05-20 MED ORDER — ONDANSETRON HCL 4 MG PO TABS: 4.0000 mg | ORAL_TABLET | Freq: Four times a day (QID) | ORAL | Status: DC | PRN

## 2024-05-20 MED ORDER — SODIUM CHLORIDE 0.9 % IV SOLN: INTRAVENOUS | Status: AC

## 2024-05-20 NOTE — ED Notes (Signed)
 Patient transported to CT

## 2024-05-20 NOTE — ED Triage Notes (Signed)
 Patient reports blood in stool that she just noticed today.  Son reports you can see her stomach cramping but patient reports no pain. Denies vomiting or vomiting blood.

## 2024-05-20 NOTE — Assessment & Plan Note (Addendum)
 Stable. -Hold lisinopril 20 mg,, and Norvasc  5 mg in the setting of GI bleed, AKI and contrast exposure

## 2024-05-20 NOTE — ED Provider Notes (Signed)
 [place] EMERGENCY DEPARTMENT AT [facility] Provider Note   CSN: [id number] Arrival date & time: 05/20/24  1639     Patient presents with: Blood In Stools   Danielle Vasquez is a 89 y.o. female.  {Add pertinent medical, surgical, social history, OB history to HPI:5092} 89 year old female presents for evaluation of blood in stool.  She states she is otherwise feeling well.  States this started today but she is on Eliquis .  Son at bedside states that was a lot of blood.  States that filled the toilet bowl.  Patient denies any other symptoms or concerns at this time.        Prior to Admission medications   Medication Sig Start Date End Date Taking? Authorizing Provider  amLODipine  (NORVASC ) 5 MG tablet Take 1 tablet (5 mg total) by mouth daily. 03/04/24 06/02/24  Okey Vina GAILS, MD  atorvastatin  (LIPITOR) 20 MG tablet Take 1 tablet (20 mg total) by mouth daily. Patient not taking: Reported on 03/30/2024 12/13/22   Okey Vina GAILS, MD  Cholecalciferol (VITAMIN D) 1000 UNITS capsule Take 1,000 Units by mouth daily.    [provider]  ELIQUIS  2.5 MG TABS tablet TAKE 1 TABLET TWICE A DAY 10/28/23   Okey Vina GAILS, MD  ezetimibe (ZETIA) 10 MG tablet Take 10 mg by mouth daily. Patient not taking: Reported on 03/30/2024    [provider]  hydrochlorothiazide  (HYDRODIURIL ) 25 MG tablet Take 1 tablet (25 mg total) by mouth daily. Patient not taking: Reported on 03/30/2024 12/13/22   Okey Vina GAILS, MD  lisinopril (PRINIVIL,ZESTRIL) 20 MG tablet Take 20 mg by mouth daily.    [provider]  metoprolol  tartrate (LOPRESSOR ) 25 MG tablet daily at 6 (six) AM. 11/18/22   [provider]  Omega-3 Fatty Acids (FISH OIL) 1200 MG CAPS Take 1,200 mg by mouth daily.     [provider]  pantoprazole  (PROTONIX ) 20 MG tablet Take 20 mg by mouth every morning. 02/17/18   [provider]    Allergies: Atorvastatin  and Diclofenac    Review of  Systems  Constitutional:  Negative for chills and fever.  HENT:  Negative for ear pain and sore throat.   Eyes:  Negative for pain and visual disturbance.  Respiratory:  Negative for cough and shortness of breath.   Cardiovascular:  Negative for chest pain and palpitations.  Gastrointestinal:  Positive for blood in stool. Negative for abdominal pain and vomiting.  Genitourinary:  Negative for dysuria and hematuria.  Musculoskeletal:  Negative for arthralgias and back pain.  Skin:  Negative for color change and rash.  Neurological:  Negative for seizures and syncope.  All other systems reviewed and are negative.   Updated Vital Signs BP 126/62 (BP Location: Right Arm)   Pulse 63   Temp (!) 97.4 F (36.3 C) (Oral)   Resp 18   Ht 5' 1 (1.549 m)   Wt 54.9 kg   SpO2 97%   BMI 22.86 kg/m   Physical Exam Vitals and nursing note reviewed.  Constitutional:      General: She is not in acute distress.    Appearance: Normal appearance. She is well-developed. She is not ill-appearing.  HENT:     Head: Normocephalic and atraumatic.  Eyes:     Conjunctiva/sclera: Conjunctivae normal.  Cardiovascular:     Rate and Rhythm: Normal rate and regular rhythm.     Heart sounds: No murmur heard. Pulmonary:     Effort: Pulmonary effort  is normal. No respiratory distress.     Breath sounds: Normal breath sounds.  Abdominal:     Palpations: Abdomen is soft.     Tenderness: There is no abdominal tenderness.  Musculoskeletal:        General: No swelling.     Cervical back: Neck supple.  Skin:    General: Skin is warm and dry.     Capillary Refill: Capillary refill takes less than 2 seconds.  Neurological:     Mental Status: She is alert.  Psychiatric:        Mood and Affect: Mood normal.     (all labs ordered are listed, but only abnormal results are displayed) Labs Reviewed  COMPREHENSIVE METABOLIC PANEL WITH GFR - Abnormal; Notable for the following components:      Result Value    Glucose, Bld 177 (*)    BUN 28 (*)    Creatinine, Ser 1.45 (*)    GFR, Estimated 34 (*)    All other components within normal limits  CBC WITH DIFFERENTIAL/PLATELET - Abnormal; Notable for the following components:   WBC 19.5 (*)    Hemoglobin 16.1 (*)    HCT 48.1 (*)    Neutro Abs 17.5 (*)    Abs Immature Granulocytes 0.09 (*)    All other components within normal limits  LACTIC ACID, PLASMA  LACTIC ACID, PLASMA  POC OCCULT BLOOD, ED  TYPE AND SCREEN    EKG: None  Radiology: No results found.  {Document cardiac monitor, telemetry assessment procedure when appropriate:32947} Procedures   Medications Ordered in the ED - No data to display    {Click here for ABCD2, HEART and other calculators REFRESH Note before signing:1}                              Medical Decision Making Amount and/or Complexity of Data Reviewed Labs: ordered. Radiology: ordered.   ***  {Document critical care time when appropriate  Document review of labs and clinical decision tools ie CHADS2VASC2, etc  Document your independent review of radiology images and any outside records  Document your discussion with family members, caretakers and with consultants  Document social determinants of health affecting pt's care  Document your decision making why or why not admission, treatments were needed:32947:::1}   Final diagnoses:  None    ED Discharge Orders     None

## 2024-05-20 NOTE — H&P (Signed)
 History and Physical    Danielle Vasquez FMW:[id number] DOB: Sep 12, 1928 DOA: 05/20/2024  PCP: Shona Norleen PEDLAR, MD  Patient coming from: Home  I have personally briefly reviewed patient's old medical records in [facility]  Chief Complaint: Rectal bleed  HPI: Danielle Vasquez is a 89 y.o. female with medical history significant for  paroxysmal atria fib on chronic anticoag, fibromyalgia. Patient presented to the ED with complaints of 1 episode of rectal bleed.  Patient's son reports patient had initial diarrhea, and subsequently had went to have another bowel movement, but it was a lot of blood.  Bleeding was painless.  She has not had any abdominal pain.  No black stool.  No vomiting.  No similar prior episodes.  She denies NSAID use. On Eliquis , last dose was this morning.  No dizziness.  No urinary symptoms.  No difficulty breathing.  ED Course: Blood pressure systolic 113-135.  Heart rate 60s to 70s.  Hemoglobin 16.1.  WBC 19.5.  Lactic acid 1.8> 2.6.  CTA abdomen and pelvis with contrast-no findings to suggest GI bleed. EDP talked with Dr. Shaaron, will see in consult in AM.  Review of Systems: As per HPI all other systems reviewed and negative.  Past Medical History:  Diagnosis Date   Dysrhythmia    fast heart beat   Fibromyalgia    GERD (gastroesophageal reflux disease)    Glaucoma    HTN (hypertension)    Hyperlipemia     Past Surgical History:  Procedure Laterality Date   CATARACT EXTRACTION W/PHACO  09/01/2012   Procedure: CATARACT EXTRACTION PHACO AND INTRAOCULAR LENS PLACEMENT (IOC);  Surgeon: Dow JULIANNA Burke, MD;  Location: [facility];  Service: Ophthalmology;  Laterality: Right;  CDE=11.36   CATARACT EXTRACTION W/PHACO Left 11/03/2012   Procedure: CATARACT EXTRACTION PHACO AND INTRAOCULAR LENS PLACEMENT (IOC);  Surgeon: Dow JULIANNA Burke, MD;  Location: [facility];  Service: Ophthalmology;  Laterality: Left;  CDE:  13.74   CHOLECYSTECTOMY     DILATION AND CURETTAGE OF  UTERUS     YAG LASER APPLICATION Right 05/28/2016   Procedure: YAG LASER APPLICATION;  Surgeon: Dow JULIANNA Burke, MD;  Location: [facility];  Service: Ophthalmology;  Laterality: Right;     reports that she has never smoked. She has never used smokeless tobacco. She reports that she does not drink alcohol and does not use drugs.  Allergies  Allergen Reactions   Atorvastatin  Dermatitis   Diclofenac Other (See Comments)    Family History  Problem Relation Age of Onset   Heart disease Unknown    Arthritis Unknown     Prior to Admission medications   Medication Sig Start Date End Date Taking? Authorizing Provider  amLODipine  (NORVASC ) 5 MG tablet Take 1 tablet (5 mg total) by mouth daily. 03/04/24 06/02/24  Okey Vina GAILS, MD  atorvastatin  (LIPITOR) 20 MG tablet Take 1 tablet (20 mg total) by mouth daily. Patient not taking: Reported on 03/30/2024 12/13/22   Okey Vina GAILS, MD  Cholecalciferol (VITAMIN D) 1000 UNITS capsule Take 1,000 Units by mouth daily.    [provider]  ELIQUIS  2.5 MG TABS tablet TAKE 1 TABLET TWICE A DAY 10/28/23   Okey Vina GAILS, MD  ezetimibe (ZETIA) 10 MG tablet Take 10 mg by mouth daily. Patient not taking: Reported on 03/30/2024    [provider]  hydrochlorothiazide  (HYDRODIURIL ) 25 MG tablet Take 1 tablet (25 mg total) by mouth daily. Patient not taking: Reported on 03/30/2024 12/13/22   Okey,  Vina GAILS, MD  lisinopril (PRINIVIL,ZESTRIL) 20 MG tablet Take 20 mg by mouth daily.    [provider]  metoprolol  tartrate (LOPRESSOR ) 25 MG tablet daily at 6 (six) AM. 11/18/22   [provider]  Omega-3 Fatty Acids (FISH OIL) 1200 MG CAPS Take 1,200 mg by mouth daily.     [provider]  pantoprazole  (PROTONIX ) 20 MG tablet Take 20 mg by mouth every morning. 02/17/18   [provider]    Physical Exam: Vitals:   05/20/24 2045 05/20/24 2212 05/20/24 2215 05/20/24 2245  BP: (!) 120/57 (!) 118/50 (!) 113/59 119/63  Pulse: 69  75 65 74  Resp: 18 18 13 19   Temp:      TempSrc:      SpO2: 97% 93% 95% 96%  Weight:      Height:        Constitutional: NAD, calm, comfortable Vitals:   05/20/24 2045 05/20/24 2212 05/20/24 2215 05/20/24 2245  BP: (!) 120/57 (!) 118/50 (!) 113/59 119/63  Pulse: 69 75 65 74  Resp: 18 18 13 19   Temp:      TempSrc:      SpO2: 97% 93% 95% 96%  Weight:      Height:       Eyes: PERRL, lids and conjunctivae normal ENMT: Mucous membranes are moist.  Neck: normal, supple, no masses, no thyromegaly Respiratory: clear to auscultation bilaterally, no wheezing, no crackles. Normal respiratory effort. No accessory muscle use.  Cardiovascular: Regular rate and rhythm, no murmurs / rubs / gallops. No extremity edema.  Extremities warm.   Abdomen: no tenderness, no masses palpated. No hepatosplenomegaly.   Musculoskeletal: no clubbing / cyanosis. No joint deformity upper and lower extremities.  Skin: no rashes, lesions, ulcers. No induration Neurologic: No facial symmetry, moves extremities spontaneously, speech fluent. Psychiatric: Normal judgment and insight. Alert and oriented x 3. Normal mood.   Labs on Admission: I have personally reviewed following labs and imaging studies  CBC: Recent Labs  Lab 05/20/24 1749  WBC 19.5*  NEUTROABS 17.5*  HGB 16.1*  HCT 48.1*  MCV 94.1  PLT 195   Basic Metabolic Panel: Recent Labs  Lab 05/20/24 1749  NA 136  K 4.8  CL 99  CO2 23  GLUCOSE 177*  BUN 28*  CREATININE 1.45*  CALCIUM  9.7   GFR: Estimated Creatinine Clearance: 18.3 mL/min (A) (by C-G formula based on SCr of 1.45 mg/dL (H)). Liver Function Tests: Recent Labs  Lab 05/20/24 1749  AST 30  ALT 18  ALKPHOS 71  BILITOT 1.1  PROT 6.9  ALBUMIN 4.1   Radiological Exams on Admission: CT Angio Abd/Pel W and/or Wo Contrast Result Date: 05/20/2024 CLINICAL DATA:  Abdominal pain and bloody stools, initial encounter EXAM: CTA ABDOMEN AND PELVIS WITHOUT AND WITH CONTRAST  TECHNIQUE: Multidetector CT imaging of the abdomen and pelvis was performed using the standard protocol during bolus administration of intravenous contrast. Multiplanar reconstructed images and MIPs were obtained and reviewed to evaluate the vascular anatomy. RADIATION DOSE REDUCTION: This exam was performed according to the departmental dose-optimization program which includes automated exposure control, adjustment of the mA and/or kV according to patient size and/or use of iterative reconstruction technique. CONTRAST:  75mL OMNIPAQUE  IOHEXOL  350 MG/ML SOLN COMPARISON:  None Available. FINDINGS: VASCULAR Aorta: Abdominal aorta demonstrates scattered atherosclerotic calcifications without aneurysmal dilatation or dissection. 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 The vessel is otherwise patent. SMA: Patent without evidence of aneurysm,  dissection, vasculitis or significant stenosis. Renals: Both renal arteries are patent without evidence of aneurysm, dissection, vasculitis, fibromuscular dysplasia or significant stenosis. IMA: Patent without evidence of aneurysm, dissection, vasculitis or significant stenosis. Inflow: Iliacs demonstrate atherosclerotic calcification without aneurysmal dilatation or dissection. Veins: No specific venous abnormality is noted. Review of the MIP images confirms the above findings. NON-VASCULAR Lower chest: No acute abnormality. Hepatobiliary: Mild fatty infiltration of the liver is noted. The gallbladder has been surgically removed. Pancreas: Unremarkable. No pancreatic ductal dilatation or surrounding inflammatory changes. Spleen: Normal in size without focal abnormality. Adrenals/Urinary Tract: Adrenal glands are within normal limits. Kidneys demonstrate a normal enhancement pattern. Right renal cysts are seen which appears simple in nature. No follow-up is recommended. No renal calculi or obstructive changes are seen. The bladder is decompressed.  Stomach/Bowel: The appendix is within normal limits. No obstructive or inflammatory changes of the colon are seen. No findings to suggest active lower GI hemorrhage are noted. Small bowel and stomach are within normal limits. No active hemorrhage is seen. Lymphatic: No lymphadenopathy is noted. Reproductive: Uterus and bilateral adnexa are unremarkable. Other: No abdominal wall hernia or abnormality. No abdominopelvic ascites. Musculoskeletal: Degenerative changes of lumbar spine are noted. IMPRESSION: VASCULAR Atherosclerotic calcifications are noted. No findings to suggest active GI hemorrhage are noted. 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 NON-VASCULAR Fatty infiltration of the liver. No other focal abnormality is noted. Electronically Signed   By: Oneil Devonshire M.D.   On: 05/20/2024 21:19   EKG: None.   Assessment/Plan Principal Problem:   Rectal bleed Active Problems:   Paroxysmal atrial fibrillation (HCC)   HTN (hypertension)  Assessment and Plan: * Rectal bleed 1 episode of bleeding per rectum.  Large amount of blood.  No melena or hematemesis.  Denies NSAID use.  Vitals stable.  Hemoglobin stable at 16.1. Denies prior history of GI bleed.  On anticoagulation with Eliquis , last dose was this morning.  No colonoscopy or EGD on file. -Hold Eliquis  -Lactic acid checked 1.8 > 2.6, give 500 mL bolus -N/s 75cc/hr x 20 hrs - N.p.o. midnight - IV Protonix  40 daily - EDP talked to Dr. Shaaron, will see in consult in a.m.   HTN (hypertension) Stable. -Hold lisinopril 20 mg,, and Norvasc  5 mg in the setting of GI bleed, AKI and contrast exposure  Paroxysmal atrial fibrillation (HCC) Rate controlled and on chronic anticoagulation. -Hold metoprolol  25 mg daily in the setting of GI bleed  Leukocytosis of 19.5.  Lactic acid 1.8 >> 2.6 1 episode of diarrhea today, no abdominal pain, no other symptoms referable to infectious etiology. -Hydrate - Trend WBC  and lactic acid for now.  AKI, creatinine 1.45.  Baseline about 0.8. - Hydrate - Hold lisinopril  DVT prophylaxis: SCDS Code Status: Full code, confirmed with patient and son at bedside.  Patient has a living will and son will bring it in. Family Communication: SonGLENWOOD Vasquez at bedside Disposition Plan: ~ 2 days Consults called: GI Admission status:  Obs Tele    Author: Tully FORBES Carwin, MD 05/20/2024 11:08 PM  For on call review [email or web address].

## 2024-05-20 NOTE — Assessment & Plan Note (Addendum)
 Rate controlled and on chronic anticoagulation. -Hold metoprolol  25 mg daily in the setting of GI bleed

## 2024-05-20 NOTE — Assessment & Plan Note (Addendum)
 1 episode of bleeding per rectum.  Large amount of blood.  No melena or hematemesis.  Denies NSAID use.  Vitals stable.  Hemoglobin stable at 16.1. Denies prior history of GI bleed.  On anticoagulation with Eliquis , last dose was this morning.  No colonoscopy or EGD on file. -Hold Eliquis  -Lactic acid checked 1.8 > 2.6, give 500 mL bolus -N/s 75cc/hr x 20 hrs - N.p.o. midnight - IV Protonix  40 daily - EDP talked to Dr. Shaaron, will see in consult in a.m.

## 2024-05-21 ENCOUNTER — Encounter (HOSPITAL_COMMUNITY): Payer: Self-pay | Admitting: Internal Medicine

## 2024-05-21 DIAGNOSIS — K625 Hemorrhage of anus and rectum: Secondary | ICD-10-CM | POA: Diagnosis not present

## 2024-05-21 LAB — BASIC METABOLIC PANEL WITH GFR
Anion gap: 7 (ref 5–15)
BUN: 29 mg/dL — ABNORMAL HIGH (ref 8–23)
CO2: 24 mmol/L (ref 22–32)
Calcium: 8.5 mg/dL — ABNORMAL LOW (ref 8.9–10.3)
Chloride: 105 mmol/L (ref 98–111)
Creatinine, Ser: 1.28 mg/dL — ABNORMAL HIGH (ref 0.44–1.00)
GFR, Estimated: 39 mL/min — ABNORMAL LOW (ref >60)
Glucose, Bld: 137 mg/dL — ABNORMAL HIGH (ref 70–99)
Potassium: 4.9 mmol/L (ref 3.5–5.1)
Sodium: 136 mmol/L (ref 135–145)

## 2024-05-21 LAB — CBC
HCT: 39.8 % (ref 36.0–46.0)
Hemoglobin: 13.5 g/dL (ref 12.0–15.0)
MCH: 31.7 pg (ref 26.0–34.0)
MCHC: 33.9 g/dL (ref 30.0–36.0)
MCV: 93.4 fL (ref 80.0–100.0)
Platelets: 177 K/uL (ref 150–400)
RBC: 4.26 MIL/uL (ref 3.87–5.11)
RDW: 12.6 % (ref 11.5–15.5)
WBC: 12.7 K/uL — ABNORMAL HIGH (ref 4.0–10.5)
nRBC: 0 % (ref 0.0–0.2)

## 2024-05-21 LAB — HEMOGLOBIN AND HEMATOCRIT, BLOOD
HCT: 40.9 % (ref 36.0–46.0)
HCT: 38.7 % (ref 36.0–46.0)
Hemoglobin: 13.5 g/dL (ref 12.0–15.0)
Hemoglobin: 12.9 g/dL (ref 12.0–15.0)

## 2024-05-21 LAB — LACTIC ACID, PLASMA: Lactic Acid, Venous: 0.9 mmol/L (ref 0.5–1.9)

## 2024-05-21 MED ORDER — PEG 3350-KCL-NA BICARB-NACL 420 G PO SOLR
4000.0000 mL | Freq: Once | ORAL | Status: AC
  Administered 2024-05-21: 4000 mL via ORAL

## 2024-05-21 MED ORDER — BISACODYL 5 MG PO TBEC
10.0000 mg | DELAYED_RELEASE_TABLET | Freq: Once | ORAL | Status: AC
  Administered 2024-05-21: 10 mg via ORAL
  Filled 2024-05-21: qty 2

## 2024-05-21 MED ORDER — SODIUM CHLORIDE 0.9 % IV SOLN: INTRAVENOUS | Status: AC

## 2024-05-21 NOTE — Progress Notes (Signed)
 Was notified by nursing that patient passed small bright red blood clots from rectum when she stool up to go to restroom. We will continue to trend Hgb. Last check was stable/normal. Will check at 1600. Continue to monitor bleeding. Dr. Cinderella to see patient this afternoon, will discuss possible colonoscopy further with patient, she previously declined when I saw her but was open to the idea if symptoms were recurrent or persistent.   Sonny RAMAN. Ezzard RIGGERS [facility] [phone] 9/18/20251:43 PM

## 2024-05-21 NOTE — Progress Notes (Signed)
  Transition of Care [facility]) Screening Note   Patient Details  Name: BREI POCIASK Date of Birth: [birth year]   Transition of Care [facility]) CM/SW Contact:    Hoy DELENA Bigness, LCSW Phone Number: 05/21/2024, 11:44 AM    Transition of Care Department [facility]) has reviewed patient and no TOC needs have been identified at this time. We will continue to monitor patient advancement through interdisciplinary progression rounds. If new patient transition needs arise, please place a TOC consult.       05/21/24 1143  TOC Brief Assessment  Insurance and Status Reviewed  Patient has primary care physician Yes  Home environment has been reviewed [facility]  Prior level of function: Independent/modified indepdent (uses a cane as needed)  Prior/Current Home Services No current home services  Social Drivers of Health Review SDOH reviewed no interventions necessary  Readmission risk has been reviewed Yes  Transition of care needs no transition of care needs at this time

## 2024-05-21 NOTE — ED Notes (Signed)
 pt stood up to go to the bathroom and had small bright red blood clots come out of her bottom. MD Cheryle notified.

## 2024-05-21 NOTE — ED Notes (Signed)
 Transporter called for patient to go to 300

## 2024-05-21 NOTE — Progress Notes (Signed)
 PROGRESS NOTE    Danielle Vasquez  FMW:[id number] DOB: May 12, 1929 DOA: 05/20/2024 PCP: Shona Norleen PEDLAR, MD   Brief Narrative:  89 year old female with history of paroxysmal A-fib on chronic anticoagulation, fibromyalgia presented with rectal bleeding.  On presentation, hemoglobin was 16.1, WBC of 19.5, lactic acid of 1.8 and 2.6.  CTA abdomen and pelvis with contrast showed no findings to suggest GI bleeding.  GI was consulted.  Assessment & Plan:   Possible lower GI bleeding presenting with rectal bleeding - Imaging as above.  No bleeding since admission.  Hemoglobin 13.5 this morning.  Monitor H&H.  Awaiting GI recommendations.  Continue IV Protonix .  Continue IV fluids  Hypertension -Stable.  Lisinopril and Norvasc  on hold.  Paroxysmal A-fib - Currently rate controlled.  Eliquis  on hold  Leukocytosis - Possibly reactive.  Improving.  Monitor  AKI - Baseline creatinine of 0.8.  Creatinine 1.45 on presentation.  Improving to 1.28.  Monitor.  Continue IV fluids.   DVT prophylaxis: SCDs Code Status: Full Family Communication: None at bedside Disposition Plan: Status is: Observation The patient will require care spanning > 2 midnights and should be moved to inpatient because: Of severity of illness.    Consultants: GI  Procedures: None  Antimicrobials: None   Subjective: Patient seen and examined at bedside.  No fever, vomiting, abdominal pain reported.  No rectal bleeding since admission.  Objective: Vitals:   05/21/24 0603 05/21/24 0700 05/21/24 0800 05/21/24 0900  BP:  (!) 116/52 (!) 129/57 131/75  Pulse: 66 61 60 63  Resp: 14 14 17  (!) 22  Temp: (!) 97.3 F (36.3 C)     TempSrc: Axillary     SpO2: 97% 95% 99% 96%  Weight:      Height:       No intake or output data in the 24 hours ending 05/21/24 1012 Filed Weights   05/20/24 1718  Weight: 54.9 kg    Examination:  General exam: Appears calm and comfortable.  Elderly female lying in bed. Respiratory  system: Bilateral decreased breath sounds at bases Cardiovascular system: S1 & S2 heard, Rate controlled Gastrointestinal system: Abdomen is nondistended, soft and nontender. Normal bowel sounds heard. Extremities: No cyanosis, clubbing, edema  Central nervous system: Alert and oriented. No focal neurological deficits. Moving extremities Skin: No rashes, lesions or ulcers Psychiatry: Judgement and insight appear normal. Mood & affect appropriate.     Data Reviewed: I have personally reviewed following labs and imaging studies  CBC: Recent Labs  Lab 05/20/24 1749 05/20/24 2335 05/21/24 0345  WBC 19.5*  --  12.7*  NEUTROABS 17.5*  --   --   HGB 16.1* 14.7 13.5  HCT 48.1* 43.2 39.8  MCV 94.1  --  93.4  PLT 195  --  177   Basic Metabolic Panel: Recent Labs  Lab 05/20/24 1749 05/21/24 0345  NA 136 136  K 4.8 4.9  CL 99 105  CO2 23 24  GLUCOSE 177* 137*  BUN 28* 29*  CREATININE 1.45* 1.28*  CALCIUM  9.7 8.5*   GFR: Estimated Creatinine Clearance: 20.7 mL/min (A) (by C-G formula based on SCr of 1.28 mg/dL (H)). Liver Function Tests: Recent Labs  Lab 05/20/24 1749  AST 30  ALT 18  ALKPHOS 71  BILITOT 1.1  PROT 6.9  ALBUMIN 4.1   No results for input(s): LIPASE, AMYLASE in the last 168 hours. No results for input(s): AMMONIA in the last 168 hours. Coagulation Profile: No results for input(s): INR, PROTIME in the  last 168 hours. Cardiac Enzymes: No results for input(s): CKTOTAL, CKMB, CKMBINDEX, TROPONINI in the last 168 hours. BNP (last 3 results) No results for input(s): PROBNP in the last 8760 hours. HbA1C: No results for input(s): HGBA1C in the last 72 hours. CBG: No results for input(s): GLUCAP in the last 168 hours. Lipid Profile: No results for input(s): CHOL, HDL, LDLCALC, TRIG, CHOLHDL, LDLDIRECT in the last 72 hours. Thyroid  Function Tests: No results for input(s): TSH, T4TOTAL, FREET4, T3FREE, THYROIDAB  in the last 72 hours. Anemia Panel: No results for input(s): VITAMINB12, FOLATE, FERRITIN, TIBC, IRON, RETICCTPCT in the last 72 hours. Sepsis Labs: Recent Labs  Lab 05/20/24 2014 05/20/24 2045 05/21/24 0345  LATICACIDVEN 1.8 2.6* 0.9    No results found for this or any previous visit (from the past 240 hours).       Radiology Studies: CT Angio Abd/Pel W and/or Wo Contrast Result Date: 05/20/2024 CLINICAL DATA:  Abdominal pain and bloody stools, initial encounter EXAM: CTA ABDOMEN AND PELVIS WITHOUT AND WITH CONTRAST TECHNIQUE: Multidetector CT imaging of the abdomen and pelvis was performed using the standard protocol during bolus administration of intravenous contrast. Multiplanar reconstructed images and MIPs were obtained and reviewed to evaluate the vascular anatomy. RADIATION DOSE REDUCTION: This exam was performed according to the departmental dose-optimization program which includes automated exposure control, adjustment of the mA and/or kV according to patient size and/or use of iterative reconstruction technique. CONTRAST:  75mL OMNIPAQUE  IOHEXOL  350 MG/ML SOLN COMPARISON:  None Available. FINDINGS: VASCULAR Aorta: Abdominal aorta demonstrates scattered atherosclerotic calcifications without aneurysmal dilatation or dissection. 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 The vessel is otherwise patent. SMA: Patent without evidence of aneurysm, dissection, vasculitis or significant stenosis. Renals: Both renal arteries are patent without evidence of aneurysm, dissection, vasculitis, fibromuscular dysplasia or significant stenosis. IMA: Patent without evidence of aneurysm, dissection, vasculitis or significant stenosis. Inflow: Iliacs demonstrate atherosclerotic calcification without aneurysmal dilatation or dissection. Veins: No specific venous abnormality is noted. Review of the MIP images confirms the above findings. NON-VASCULAR Lower chest: No acute  abnormality. Hepatobiliary: Mild fatty infiltration of the liver is noted. The gallbladder has been surgically removed. Pancreas: Unremarkable. No pancreatic ductal dilatation or surrounding inflammatory changes. Spleen: Normal in size without focal abnormality. Adrenals/Urinary Tract: Adrenal glands are within normal limits. Kidneys demonstrate a normal enhancement pattern. Right renal cysts are seen which appears simple in nature. No follow-up is recommended. No renal calculi or obstructive changes are seen. The bladder is decompressed. Stomach/Bowel: The appendix is within normal limits. No obstructive or inflammatory changes of the colon are seen. No findings to suggest active lower GI hemorrhage are noted. Small bowel and stomach are within normal limits. No active hemorrhage is seen. Lymphatic: No lymphadenopathy is noted. Reproductive: Uterus and bilateral adnexa are unremarkable. Other: No abdominal wall hernia or abnormality. No abdominopelvic ascites. Musculoskeletal: Degenerative changes of lumbar spine are noted. IMPRESSION: VASCULAR Atherosclerotic calcifications are noted. No findings to suggest active GI hemorrhage are noted. 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 NON-VASCULAR Fatty infiltration of the liver. No other focal abnormality is noted. Electronically Signed   By: Oneil Devonshire M.D.   On: 05/20/2024 21:19        Scheduled Meds:  pantoprazole  (PROTONIX ) IV  40 mg Intravenous Q24H   Continuous Infusions:  sodium chloride  75 mL/hr at 05/21/24 0049          Sophie Mao, MD [facility] 05/21/2024, 10:12 AM

## 2024-05-21 NOTE — Consult Note (Signed)
 Gastroenterology Consult   Referring Provider: No ref. provider found Primary Care Physician:  Shona Norleen PEDLAR, MD Primary Gastroenterologist:  remotely seen by Dr. Golda  Patient ID: [place] Danielle Vasquez; [id number]; 01-31-1929   Admit date: 05/20/2024  LOS: 0 days   Date of Consultation: 05/21/2024  Reason for Consultation:  rectal bleeding    History of Present Illness   Danielle Vasquez is a 89 y.o. female with paroxysmal atrial fibrillation on Eliquis , hypertension, glaucoma, fibromyalgia presented to the ED with complaints of diarrhea with rectal bleeding.    ED course BUN 28, creatinine 1.45, glucose 177, white blood cell count 19.5, hemoglobin 16.1, platelets 195, lactic acid initially 1.8 then increased to 2.6.  Follow-up hemoglobin 14.7.  Today her white blood cell count is down to 12.7, hemoglobin 13.5 (suspect hemodilution in part).  BUN 29, creatinine 1.28, lactic acid 0.9.  CTA abdomen pelvis with and without contrast yesterday with no findings to suggest active GI bleed.  GI consult: Patient lives at [employer] living. She reports acute onset diarrhea yesterday morning after breakfast. She had several episodes prior to son's arrival. She subsequently had what they describe as moderate amount of blood per rectum. No melena. She has not had anything like this before. She notes typically has regular BMs but about 3 weeks ago she had some constipation and used a Fleet enema. No n/v. No abdominal pain. Due to bleeding in the setting of Eliquis , she presented to ED for evaluation. Her last dose of Eliquis  was 9/17 in AM. No NSAIDs or ASA.  Denies dizziness, difficulty breathing. No heartburn on pantoprazole . No dysphagia. Appetite is good. No n/v. She said this morning she passed a very small amount of blood with possible clot but diarrhea has resolved. She wonders about food related diarrhea given acute onset after breakfast.  EGD 2006: -Noncritical ring at GE  junction status post dilation - Mild reflux esophagitis at GE junction -Small hiatal hernia -Erosive antral gastritis  Colonoscopy 2006: - Normal except for external hemorrhoids   Prior to Admission medications   Medication Sig Start Date End Date Taking? Authorizing Provider  amLODipine  (NORVASC ) 5 MG tablet Take 1 tablet (5 mg total) by mouth daily. 03/04/24 06/02/24  Okey Vina GAILS, MD  atorvastatin  (LIPITOR) 20 MG tablet Take 1 tablet (20 mg total) by mouth daily. Patient not taking: Reported on 03/30/2024 12/13/22   Okey Vina GAILS, MD  Cholecalciferol (VITAMIN D) 1000 UNITS capsule Take 1,000 Units by mouth daily.    [provider]  ELIQUIS  2.5 MG TABS tablet TAKE 1 TABLET TWICE A DAY 10/28/23   Okey Vina GAILS, MD  ezetimibe (ZETIA) 10 MG tablet Take 10 mg by mouth daily. Patient not taking: Reported on 03/30/2024    [provider]  hydrochlorothiazide  (HYDRODIURIL ) 25 MG tablet Take 1 tablet (25 mg total) by mouth daily. Patient not taking: Reported on 03/30/2024 12/13/22   Okey Vina GAILS, MD  lisinopril (PRINIVIL,ZESTRIL) 20 MG tablet Take 20 mg by mouth daily.    [provider]  metoprolol  tartrate (LOPRESSOR ) 25 MG tablet daily at 6 (six) AM. 11/18/22   [provider]  Omega-3 Fatty Acids (FISH OIL) 1200 MG CAPS Take 1,200 mg by mouth daily.     [provider]  pantoprazole  (PROTONIX ) 20 MG tablet Take 20 mg by mouth every morning. 02/17/18   [provider]    Current Facility-Administered Medications  Medication Dose Route Frequency Provider Last Rate Last  Admin   0.9 %  sodium chloride  infusion   Intravenous Continuous Emokpae, Ejiroghene E, MD 75 mL/hr at 05/21/24 0049 New Bag at 05/21/24 0049   acetaminophen  (TYLENOL ) tablet 650 mg  650 mg Oral Q6H PRN Emokpae, Ejiroghene E, MD       Or   acetaminophen  (TYLENOL ) suppository 650 mg  650 mg Rectal Q6H PRN Emokpae, Ejiroghene E, MD       ondansetron  (ZOFRAN ) tablet 4 mg  4 mg Oral  Q6H PRN Emokpae, Ejiroghene E, MD       Or   ondansetron  (ZOFRAN ) injection 4 mg  4 mg Intravenous Q6H PRN Emokpae, Ejiroghene E, MD       pantoprazole  (PROTONIX ) injection 40 mg  40 mg Intravenous Q24H Emokpae, Ejiroghene E, MD   40 mg at 05/20/24 2347   polyethylene glycol (MIRALAX  / GLYCOLAX ) packet 17 g  17 g Oral Daily PRN Emokpae, Ejiroghene E, MD       Current Outpatient Medications  Medication Sig Dispense Refill   amLODipine  (NORVASC ) 5 MG tablet Take 1 tablet (5 mg total) by mouth daily. 90 tablet 3   atorvastatin  (LIPITOR) 20 MG tablet Take 1 tablet (20 mg total) by mouth daily. (Patient not taking: Reported on 03/30/2024) 90 tablet 3   Cholecalciferol (VITAMIN D) 1000 UNITS capsule Take 1,000 Units by mouth daily.     ELIQUIS  2.5 MG TABS tablet TAKE 1 TABLET TWICE A DAY 180 tablet 3   ezetimibe (ZETIA) 10 MG tablet Take 10 mg by mouth daily. (Patient not taking: Reported on 03/30/2024)     hydrochlorothiazide  (HYDRODIURIL ) 25 MG tablet Take 1 tablet (25 mg total) by mouth daily. (Patient not taking: Reported on 03/30/2024) 90 tablet 3   lisinopril (PRINIVIL,ZESTRIL) 20 MG tablet Take 20 mg by mouth daily.     metoprolol  tartrate (LOPRESSOR ) 25 MG tablet daily at 6 (six) AM.     Omega-3 Fatty Acids (FISH OIL) 1200 MG CAPS Take 1,200 mg by mouth daily.      pantoprazole  (PROTONIX ) 20 MG tablet Take 20 mg by mouth every morning.  1    Allergies as of 05/20/2024 - Review Complete 05/20/2024  Allergen Reaction Noted   Atorvastatin  Dermatitis 03/30/2024   Diclofenac Other (See Comments) 03/30/2024    Past Medical History:  Diagnosis Date   Dysrhythmia    fast heart beat   Fibromyalgia    GERD (gastroesophageal reflux disease)    Glaucoma    HTN (hypertension)    Hyperlipemia    Paroxysmal atrial fibrillation [facility])     Past Surgical History:  Procedure Laterality Date   CATARACT EXTRACTION W/PHACO  09/01/2012   Procedure: CATARACT EXTRACTION PHACO AND INTRAOCULAR LENS  PLACEMENT (IOC);  Surgeon: Dow Danielle Burke, MD;  Location: [facility];  Service: Ophthalmology;  Laterality: Right;  CDE=11.36   CATARACT EXTRACTION W/PHACO Left 11/03/2012   Procedure: CATARACT EXTRACTION PHACO AND INTRAOCULAR LENS PLACEMENT (IOC);  Surgeon: Dow Danielle Burke, MD;  Location: [facility];  Service: Ophthalmology;  Laterality: Left;  CDE:  13.74   CHOLECYSTECTOMY     DILATION AND CURETTAGE OF UTERUS     YAG LASER APPLICATION Right 05/28/2016   Procedure: YAG LASER APPLICATION;  Surgeon: Dow Danielle Burke, MD;  Location: [facility];  Service: Ophthalmology;  Laterality: Right;    Family History  Problem Relation Age of Onset   Heart disease Unknown    Arthritis Unknown     Social History   Socioeconomic History   Marital  status: Widowed    Spouse name: Not on file   Number of children: Not on file   Years of education: 12   Highest education level: Not on file  Occupational History   Not on file  Tobacco Use   Smoking status: Never   Smokeless tobacco: Never  Vaping Use   Vaping status: Never Used  Substance and Sexual Activity   Alcohol use: No   Drug use: No   Sexual activity: Yes    Birth control/protection: Post-menopausal  Other Topics Concern   Not on file  Social History Narrative   Not on file   Social Drivers of Health   Financial Resource Strain: Not on file  Food Insecurity: Not on file  Transportation Needs: Not on file  Physical Activity: Not on file  Stress: Not on file  Social Connections: Not on file  Intimate Partner Violence: Not on file     Review of System:   General: Negative for anorexia, weight loss, fever, chills, fatigue, weakness. Eyes: Negative for vision changes.  ENT: Negative for hoarseness, difficulty swallowing , nasal congestion. CV: Negative for chest pain, angina, palpitations, dyspnea on exertion, peripheral edema.  Respiratory: Negative for dyspnea at rest, dyspnea on exertion, cough, sputum, wheezing.  GI: See history of  present illness. GU:  Negative for dysuria, hematuria, urinary incontinence, urinary frequency, nocturnal urination.  MS: Negative for joint pain, low back pain.  Derm: Negative for rash or itching.  Neuro: Negative for weakness, abnormal sensation, seizure, frequent headaches, memory loss, confusion.  Psych: Negative for anxiety, depression, suicidal ideation, hallucinations.  Endo: Negative for unusual weight change.  Heme: Negative for bruising or bleeding. Allergy: Negative for rash or hives.      Physical Examination:   Vital signs in last 24 hours: Temp:  [97.3 F (36.3 C)-98.1 F (36.7 C)] 97.3 F (36.3 C) (09/18 0603) Pulse Rate:  [60-75] 60 (09/18 0800) Resp:  [13-19] 17 (09/18 0800) BP: (97-135)/(50-68) 129/57 (09/18 0800) SpO2:  [93 %-99 %] 99 % (09/18 0800) Weight:  [54.9 kg] 54.9 kg (09/17 1718)    General: Well-nourished, well-developed in no acute distress.  Head: Normocephalic, atraumatic.   Eyes: Conjunctiva pink, no icterus. Mouth: Oropharyngeal mucosa moist and pink , no lesions erythema or exudate. Neck: Supple without thyromegaly, masses, or lymphadenopathy.  Lungs: Clear to auscultation bilaterally.  Heart: Regular rate and rhythm, no murmurs rubs or gallops.  Abdomen: Bowel sounds are normal, nontender, nondistended, no hepatosplenomegaly or masses, no abdominal bruits or hernia , no rebound or guarding.   Rectal: moderate sized hemorrhoid noted externally without signs of recent bleeding and palpated to extend into anal canal. No rectal pain or masses. No blood noted on gloved finger however after removal of finger, blood began to ooze out of the anal canal.  Extremities: No lower extremity edema, clubbing, deformity.  Neuro: Alert and oriented x 4 , grossly normal neurologically.  Skin: Warm and dry, no rash or jaundice.   Psych: Alert and cooperative, normal mood and affect.        Intake/Output from previous day: No intake/output data  recorded. Intake/Output this shift: No intake/output data recorded.  Lab Results:   CBC Recent Labs    05/20/24 1749 05/20/24 2335 05/21/24 0345  WBC 19.5*  --  12.7*  HGB 16.1* 14.7 13.5  HCT 48.1* 43.2 39.8  MCV 94.1  --  93.4  PLT 195  --  177   BMET Recent Labs    05/20/24  1749 05/21/24 0345  NA 136 136  K 4.8 4.9  CL 99 105  CO2 23 24  GLUCOSE 177* 137*  BUN 28* 29*  CREATININE 1.45* 1.28*  CALCIUM  9.7 8.5*   LFT Recent Labs    05/20/24 1749  BILITOT 1.1  ALKPHOS 71  AST 30  ALT 18  PROT 6.9  ALBUMIN 4.1    Lipase No results for input(s): LIPASE in the last 72 hours.  PT/INR No results for input(s): LABPROT, INR in the last 72 hours.   Hepatitis Panel No results for input(s): HEPBSAG, HCVAB, HEPAIGM, HEPBIGM in the last 72 hours.   Imaging Studies:   CT Angio Abd/Pel W and/or Wo Contrast Result Date: 05/20/2024 CLINICAL DATA:  Abdominal pain and bloody stools, initial encounter EXAM: CTA ABDOMEN AND PELVIS WITHOUT AND WITH CONTRAST TECHNIQUE: Multidetector CT imaging of the abdomen and pelvis was performed using the standard protocol during bolus administration of intravenous contrast. Multiplanar reconstructed images and MIPs were obtained and reviewed to evaluate the vascular anatomy. RADIATION DOSE REDUCTION: This exam was performed according to the departmental dose-optimization program which includes automated exposure control, adjustment of the mA and/or kV according to patient size and/or use of iterative reconstruction technique. CONTRAST:  75mL OMNIPAQUE  IOHEXOL  350 MG/ML SOLN COMPARISON:  None Available. FINDINGS: VASCULAR Aorta: Abdominal aorta demonstrates scattered atherosclerotic calcifications without aneurysmal dilatation or dissection. 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 The vessel is otherwise patent. SMA: Patent without evidence of aneurysm, dissection, vasculitis or significant stenosis.  Renals: Both renal arteries are patent without evidence of aneurysm, dissection, vasculitis, fibromuscular dysplasia or significant stenosis. IMA: Patent without evidence of aneurysm, dissection, vasculitis or significant stenosis. Inflow: Iliacs demonstrate atherosclerotic calcification without aneurysmal dilatation or dissection. Veins: No specific venous abnormality is noted. Review of the MIP images confirms the above findings. NON-VASCULAR Lower chest: No acute abnormality. Hepatobiliary: Mild fatty infiltration of the liver is noted. The gallbladder has been surgically removed. Pancreas: Unremarkable. No pancreatic ductal dilatation or surrounding inflammatory changes. Spleen: Normal in size without focal abnormality. Adrenals/Urinary Tract: Adrenal glands are within normal limits. Kidneys demonstrate a normal enhancement pattern. Right renal cysts are seen which appears simple in nature. No follow-up is recommended. No renal calculi or obstructive changes are seen. The bladder is decompressed. Stomach/Bowel: The appendix is within normal limits. No obstructive or inflammatory changes of the colon are seen. No findings to suggest active lower GI hemorrhage are noted. Small bowel and stomach are within normal limits. No active hemorrhage is seen. Lymphatic: No lymphadenopathy is noted. Reproductive: Uterus and bilateral adnexa are unremarkable. Other: No abdominal wall hernia or abnormality. No abdominopelvic ascites. Musculoskeletal: Degenerative changes of lumbar spine are noted. IMPRESSION: VASCULAR Atherosclerotic calcifications are noted. No findings to suggest active GI hemorrhage are noted. 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 NON-VASCULAR Fatty infiltration of the liver. No other focal abnormality is noted. Electronically Signed   By: Oneil Devonshire M.D.   On: 05/20/2024 21:19  [4 week]  Assessment:   89 y/o female with paroxysmal atrial fibrillation on  Eliquis , hypertension, glaucoma, fibromyalgia presented to the ED with complaints of diarrhea and rectal bleeding.   Rectal bleeding:   -in setting of acute onset diarrhea and Eliquis  -last dose of Eliquis  9/17 in AM -presenting with dehydration, hemoconcentration of Hgb at 16.1 -with fluids, her Hgb is now 13.5 -continues with small volume blood per rectum, diarrhea resolved -CTA negative  for active GI bleeding, no mesenteric ischemia, normal appearing colon, small bowel, stomach. No mention of diverticulosis -remote colonoscopy with history of hemorrhoids -etiology of bleeding likely due to lower GI source, could be secondary to benign anorectal source in setting of diarrhea and anticoagulation, colitis (CT neg), diverticular bleeding (CT neg), less likely malignancy.  -discussed possible colonoscopy with patient given ongoing AC requirements and no recent colonoscopy, at this time she is not 100% agreeable. She prefers to avoid if possible, but she and her son Silver are agreeable to discuss further later today with Dr. Cinderella and see how things go throughout the day with the bleeding  Plan:   Would consider inpatient colonoscopy especially if persistent bleeding, further decline in Hgb, etc. Trend H/H. Clear liquid diet.  Continue to hold Eliquis .  PPI.   LOS: 0 days   We would like to thank you for the opportunity to participate in the care of Danielle Vasquez.  Sonny RAMAN. Ezzard RIGGERS [facility] [phone] 9/18/20258:23 AM

## 2024-05-22 ENCOUNTER — Inpatient Hospital Stay (HOSPITAL_COMMUNITY): Admitting: Anesthesiology

## 2024-05-22 ENCOUNTER — Encounter (HOSPITAL_COMMUNITY)
Admission: EM | Disposition: A | Discharge status: Home / Self Care | Payer: Self-pay | Source: Home / Self Care | Attending: Internal Medicine

## 2024-05-22 ENCOUNTER — Encounter (HOSPITAL_COMMUNITY): Payer: Self-pay | Admitting: Internal Medicine

## 2024-05-22 DIAGNOSIS — E860 Dehydration: Secondary | ICD-10-CM | POA: Diagnosis present

## 2024-05-22 DIAGNOSIS — K529 Noninfective gastroenteritis and colitis, unspecified: Secondary | ICD-10-CM | POA: Diagnosis not present

## 2024-05-22 DIAGNOSIS — E785 Hyperlipidemia, unspecified: Secondary | ICD-10-CM | POA: Diagnosis present

## 2024-05-22 DIAGNOSIS — I480 Paroxysmal atrial fibrillation: Secondary | ICD-10-CM | POA: Diagnosis present

## 2024-05-22 DIAGNOSIS — K922 Gastrointestinal hemorrhage, unspecified: Secondary | ICD-10-CM | POA: Diagnosis present

## 2024-05-22 DIAGNOSIS — K625 Hemorrhage of anus and rectum: Secondary | ICD-10-CM

## 2024-05-22 DIAGNOSIS — M797 Fibromyalgia: Secondary | ICD-10-CM | POA: Diagnosis present

## 2024-05-22 DIAGNOSIS — N179 Acute kidney failure, unspecified: Secondary | ICD-10-CM | POA: Diagnosis present

## 2024-05-22 DIAGNOSIS — K921 Melena: Secondary | ICD-10-CM | POA: Diagnosis present

## 2024-05-22 DIAGNOSIS — I10 Essential (primary) hypertension: Secondary | ICD-10-CM | POA: Diagnosis present

## 2024-05-22 DIAGNOSIS — D123 Benign neoplasm of transverse colon: Secondary | ICD-10-CM

## 2024-05-22 DIAGNOSIS — K635 Polyp of colon: Secondary | ICD-10-CM | POA: Diagnosis present

## 2024-05-22 DIAGNOSIS — K649 Unspecified hemorrhoids: Secondary | ICD-10-CM | POA: Diagnosis present

## 2024-05-22 DIAGNOSIS — Z79899 Other long term (current) drug therapy: Secondary | ICD-10-CM | POA: Diagnosis not present

## 2024-05-22 DIAGNOSIS — F0390 Unspecified dementia without behavioral disturbance: Secondary | ICD-10-CM | POA: Diagnosis present

## 2024-05-22 DIAGNOSIS — Z7901 Long term (current) use of anticoagulants: Secondary | ICD-10-CM | POA: Diagnosis not present

## 2024-05-22 DIAGNOSIS — Z888 Allergy status to other drugs, medicaments and biological substances status: Secondary | ICD-10-CM | POA: Diagnosis not present

## 2024-05-22 DIAGNOSIS — I499 Cardiac arrhythmia, unspecified: Secondary | ICD-10-CM | POA: Diagnosis not present

## 2024-05-22 DIAGNOSIS — H409 Unspecified glaucoma: Secondary | ICD-10-CM | POA: Diagnosis present

## 2024-05-22 DIAGNOSIS — K219 Gastro-esophageal reflux disease without esophagitis: Secondary | ICD-10-CM | POA: Diagnosis present

## 2024-05-22 DIAGNOSIS — K559 Vascular disorder of intestine, unspecified: Secondary | ICD-10-CM | POA: Diagnosis not present

## 2024-05-22 LAB — BASIC METABOLIC PANEL WITH GFR
Anion gap: 7 (ref 5–15)
BUN: 15 mg/dL (ref 8–23)
CO2: 23 mmol/L (ref 22–32)
Calcium: 8.7 mg/dL — ABNORMAL LOW (ref 8.9–10.3)
Chloride: 107 mmol/L (ref 98–111)
Creatinine, Ser: 0.76 mg/dL (ref 0.44–1.00)
GFR, Estimated: >60 mL/min (ref >60)
Glucose, Bld: 99 mg/dL (ref 70–99)
Potassium: 4.4 mmol/L (ref 3.5–5.1)
Sodium: 137 mmol/L (ref 135–145)

## 2024-05-22 LAB — MAGNESIUM: Magnesium: 1.8 mg/dL (ref 1.7–2.4)

## 2024-05-22 LAB — CBC WITH DIFFERENTIAL/PLATELET
Abs Immature Granulocytes: 0.04 K/uL (ref 0.00–0.07)
Basophils Absolute: 0.1 K/uL (ref 0.0–0.1)
Basophils Relative: 1 %
Eosinophils Absolute: 0.1 K/uL (ref 0.0–0.5)
Eosinophils Relative: 1 %
HCT: 39.7 % (ref 36.0–46.0)
Hemoglobin: 13 g/dL (ref 12.0–15.0)
Immature Granulocytes: 0 %
Lymphocytes Relative: 16 %
Lymphs Abs: 1.7 K/uL (ref 0.7–4.0)
MCH: 31.3 pg (ref 26.0–34.0)
MCHC: 32.7 g/dL (ref 30.0–36.0)
MCV: 95.4 fL (ref 80.0–100.0)
Monocytes Absolute: 0.9 K/uL (ref 0.1–1.0)
Monocytes Relative: 8 %
Neutro Abs: 7.9 K/uL — ABNORMAL HIGH (ref 1.7–7.7)
Neutrophils Relative %: 74 %
Platelets: 156 K/uL (ref 150–400)
RBC: 4.16 MIL/uL (ref 3.87–5.11)
RDW: 12.5 % (ref 11.5–15.5)
WBC: 10.8 K/uL — ABNORMAL HIGH (ref 4.0–10.5)
nRBC: 0 % (ref 0.0–0.2)

## 2024-05-22 SURGERY — COLONOSCOPY
Anesthesia: General

## 2024-05-22 MED ORDER — HYDROCORTISONE 1 % EX CREA
TOPICAL_CREAM | Freq: Two times a day (BID) | CUTANEOUS | Status: DC
  Filled 2024-05-22: qty 28

## 2024-05-22 MED ORDER — AMOXICILLIN-POT CLAVULANATE 875-125 MG PO TABS
1.0000 | ORAL_TABLET | Freq: Two times a day (BID) | ORAL | Status: DC
Start: 2024-05-22 — End: 2024-05-23
  Administered 2024-05-22 – 2024-05-23 (×2): 1 via ORAL
  Filled 2024-05-22 (×2): qty 1

## 2024-05-22 MED ORDER — LACTATED RINGERS IV SOLN: INTRAVENOUS | Status: DC

## 2024-05-22 MED ORDER — STERILE WATER FOR IRRIGATION IR SOLN
Status: DC | PRN
  Administered 2024-05-22: 120 mL
  Administered 2024-05-22: 60 mL

## 2024-05-22 MED ORDER — PROPOFOL 500 MG/50ML IV EMUL
INTRAVENOUS | Status: DC | PRN
  Administered 2024-05-22: 70 mg via INTRAVENOUS
  Administered 2024-05-22: 125 ug/kg/min via INTRAVENOUS

## 2024-05-22 MED ORDER — HYDROCORTISONE 0.5 % EX CREA: TOPICAL_CREAM | Freq: Three times a day (TID) | CUTANEOUS | Status: DC | PRN

## 2024-05-22 NOTE — Brief Op Note (Signed)
 05/22/2024  3:17 PM  PATIENT:  Danielle Vasquez  89 y.o. female  PRE-OPERATIVE DIAGNOSIS:  rectal bleeding  POST-OPERATIVE DIAGNOSIS:  sigmoid congestion; sigmoid erythema and inflammation in multiple areas 10cm-22cm; transverse colon polyp (CS); sigmoid biopsies r/o ischemic colitis;  PROCEDURE:  Procedure(s): COLONOSCOPY (N/A)  SURGEON:  Surgeons and Role:    * Eartha Angelia Sieving, MD - Primary  Patient underwent colonoscopy under propofol  sedation.  Tolerated the procedure adequately.   FINDINGS: - One 4 mm polyp in the transverse colon, removed with a cold snare.  Resected and retrieved.  - Segmental moderate inflammation was found from 10 to 20 cm proximal to the anus secondary to possible ischemic colitis.  Biopsied.  - The distal rectum and anal verge are normal on retroflexion view.   RECOMMENDATIONS - Return patient to hospital ward for ongoing care.  - Resume previous diet.  - Await pathology results.  - Start Augmentin  BID for 5 days. - Daily H/H  Sieving Eartha, MD Gastroenterology and Hepatology [facility]

## 2024-05-22 NOTE — Progress Notes (Signed)
 PROGRESS NOTE    Danielle Vasquez  FMW:[id number] DOB: 09-27-1928 DOA: 05/20/2024 PCP: Shona Norleen PEDLAR, MD   Brief Narrative:  89 year old female with history of paroxysmal A-fib on chronic anticoagulation, fibromyalgia presented with rectal bleeding.  On presentation, hemoglobin was 16.1, WBC of 19.5, lactic acid of 1.8 and 2.6.  CTA abdomen and pelvis with contrast showed no findings to suggest GI bleeding.  GI was consulted.  Assessment & Plan:   Possible lower GI bleeding presenting with rectal bleeding - Imaging as above.  Hemoglobin 13 this morning.  Monitor H&H.  GI following and planning for colonoscopy today.  Continue IV Protonix .    Hypertension -Stable.  Lisinopril and Norvasc  on hold.  Paroxysmal A-fib - Currently rate controlled.  Eliquis  on hold  Leukocytosis - Possibly reactive.  Resolved.  AKI - Baseline creatinine of 0.8.  Creatinine 1.45 on presentation.  Improving to 0.76 today.     DVT prophylaxis: SCDs Code Status: Full Family Communication: Son at bedside Disposition Plan: Status is: Observation The patient will require care spanning > 2 midnights and should be moved to inpatient because: Of severity of illness.    Consultants: GI  Procedures: None  Antimicrobials: None   Subjective: Patient seen and examined at bedside.  No chest pain, shortness of breath, abdominal pain reported. Objective: Vitals:   05/21/24 1834 05/21/24 1946 05/22/24 0222 05/22/24 0513  BP: (!) 147/84 (!) 169/70 (!) 152/73 (!) 146/78  Pulse: 88 97 81 91  Resp:  18 18 17   Temp: 98 F (36.7 C) 99.1 F (37.3 C) 98.3 F (36.8 C) 98.2 F (36.8 C)  TempSrc: Oral Oral Oral Oral  SpO2: 96% 97% 97% 96%  Weight:      Height:        Intake/Output Summary (Last 24 hours) at 05/22/2024 0846 Last data filed at 05/21/2024 1450 Gross per 24 hour  Intake --  Output 300 ml  Net -300 ml   Filed Weights   05/20/24 1718  Weight: 54.9 kg    Examination:  General: On room  air.  No distress ENT/neck: No thyromegaly.  JVD is not elevated  respiratory: Decreased breath sounds at bases bilaterally with some crackles; no wheezing CVS: S1-S2 heard, rate controlled currently Abdominal: Soft, nontender, slightly distended; no organomegaly, bowel sounds are heard Extremities: Trace lower extremity edema; no cyanosis  CNS: Awake and alert.  No focal neurologic deficit.  Moves extremities Lymph: No obvious lymphadenopathy Skin: No obvious ecchymosis/lesions  psych: Flat affect.  Not agitated.   Musculoskeletal: No obvious joint swelling/deformity      Data Reviewed: I have personally reviewed following labs and imaging studies  CBC: Recent Labs  Lab 05/20/24 1749 05/20/24 2335 05/21/24 0345 05/21/24 1029 05/21/24 1604 05/22/24 0302  WBC 19.5*  --  12.7*  --   --  10.8*  NEUTROABS 17.5*  --   --   --   --  7.9*  HGB 16.1* 14.7 13.5 13.5 12.9 13.0  HCT 48.1* 43.2 39.8 40.9 38.7 39.7  MCV 94.1  --  93.4  --   --  95.4  PLT 195  --  177  --   --  156   Basic Metabolic Panel: Recent Labs  Lab 05/20/24 1749 05/21/24 0345 05/22/24 0302  NA 136 136 137  K 4.8 4.9 4.4  CL 99 105 107  CO2 23 24 23   GLUCOSE 177* 137* 99  BUN 28* 29* 15  CREATININE 1.45* 1.28* 0.76  CALCIUM  9.7  8.5* 8.7*  MG  --   --  1.8   GFR: Estimated Creatinine Clearance: 33.2 mL/min (by C-G formula based on SCr of 0.76 mg/dL). Liver Function Tests: Recent Labs  Lab 05/20/24 1749  AST 30  ALT 18  ALKPHOS 71  BILITOT 1.1  PROT 6.9  ALBUMIN 4.1   No results for input(s): LIPASE, AMYLASE in the last 168 hours. No results for input(s): AMMONIA in the last 168 hours. Coagulation Profile: No results for input(s): INR, PROTIME in the last 168 hours. Cardiac Enzymes: No results for input(s): CKTOTAL, CKMB, CKMBINDEX, TROPONINI in the last 168 hours. BNP (last 3 results) No results for input(s): PROBNP in the last 8760 hours. HbA1C: No results for  input(s): HGBA1C in the last 72 hours. CBG: No results for input(s): GLUCAP in the last 168 hours. Lipid Profile: No results for input(s): CHOL, HDL, LDLCALC, TRIG, CHOLHDL, LDLDIRECT in the last 72 hours. Thyroid  Function Tests: No results for input(s): TSH, T4TOTAL, FREET4, T3FREE, THYROIDAB in the last 72 hours. Anemia Panel: No results for input(s): VITAMINB12, FOLATE, FERRITIN, TIBC, IRON, RETICCTPCT in the last 72 hours. Sepsis Labs: Recent Labs  Lab 05/20/24 2014 05/20/24 2045 05/21/24 0345  LATICACIDVEN 1.8 2.6* 0.9    No results found for this or any previous visit (from the past 240 hours).       Radiology Studies: CT Angio Abd/Pel W and/or Wo Contrast Result Date: 05/20/2024 CLINICAL DATA:  Abdominal pain and bloody stools, initial encounter EXAM: CTA ABDOMEN AND PELVIS WITHOUT AND WITH CONTRAST TECHNIQUE: Multidetector CT imaging of the abdomen and pelvis was performed using the standard protocol during bolus administration of intravenous contrast. Multiplanar reconstructed images and MIPs were obtained and reviewed to evaluate the vascular anatomy. RADIATION DOSE REDUCTION: This exam was performed according to the departmental dose-optimization program which includes automated exposure control, adjustment of the mA and/or kV according to patient size and/or use of iterative reconstruction technique. CONTRAST:  75mL OMNIPAQUE  IOHEXOL  350 MG/ML SOLN COMPARISON:  None Available. FINDINGS: VASCULAR Aorta: Abdominal aorta demonstrates scattered atherosclerotic calcifications without aneurysmal dilatation or dissection. 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 The vessel is otherwise patent. SMA: Patent without evidence of aneurysm, dissection, vasculitis or significant stenosis. Renals: Both renal arteries are patent without evidence of aneurysm, dissection, vasculitis, fibromuscular dysplasia or significant stenosis.  IMA: Patent without evidence of aneurysm, dissection, vasculitis or significant stenosis. Inflow: Iliacs demonstrate atherosclerotic calcification without aneurysmal dilatation or dissection. Veins: No specific venous abnormality is noted. Review of the MIP images confirms the above findings. NON-VASCULAR Lower chest: No acute abnormality. Hepatobiliary: Mild fatty infiltration of the liver is noted. The gallbladder has been surgically removed. Pancreas: Unremarkable. No pancreatic ductal dilatation or surrounding inflammatory changes. Spleen: Normal in size without focal abnormality. Adrenals/Urinary Tract: Adrenal glands are within normal limits. Kidneys demonstrate a normal enhancement pattern. Right renal cysts are seen which appears simple in nature. No follow-up is recommended. No renal calculi or obstructive changes are seen. The bladder is decompressed. Stomach/Bowel: The appendix is within normal limits. No obstructive or inflammatory changes of the colon are seen. No findings to suggest active lower GI hemorrhage are noted. Small bowel and stomach are within normal limits. No active hemorrhage is seen. Lymphatic: No lymphadenopathy is noted. Reproductive: Uterus and bilateral adnexa are unremarkable. Other: No abdominal wall hernia or abnormality. No abdominopelvic ascites. Musculoskeletal: Degenerative changes of lumbar spine are noted. IMPRESSION: VASCULAR Atherosclerotic calcifications are noted. No findings to  suggest active GI hemorrhage are noted. 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 NON-VASCULAR Fatty infiltration of the liver. No other focal abnormality is noted. Electronically Signed   By: Oneil Devonshire M.D.   On: 05/20/2024 21:19        Scheduled Meds:  hydrocortisone  cream   Topical BID   pantoprazole  (PROTONIX ) IV  40 mg Intravenous Q24H   Continuous Infusions:  sodium chloride  20 mL/hr at 05/21/24 2200          Sophie Mao,  MD [facility] 05/22/2024, 8:46 AM

## 2024-05-22 NOTE — Transfer of Care (Signed)
 Immediate Anesthesia Transfer of Care Note  Patient: Danielle Vasquez  Procedure(s) Performed: COLONOSCOPY  Patient Location: PACU  Anesthesia Type:General  Level of Consciousness: drowsy  Airway & Oxygen Therapy: Patient Spontanous Breathing  Post-op Assessment: Report given to RN and Post -op Vital signs reviewed and stable  Post vital signs: Reviewed and stable  Last Vitals:  Vitals Value Taken Time  BP 151/77   Temp 98.4   Pulse 97 05/22/24 15:14  Resp 20 05/22/24 15:14  SpO2 94 % 05/22/24 15:14  Vitals shown include unfiled device data.  Last Pain:  Vitals:   05/22/24 1443  TempSrc:   PainSc: 0-No pain         Complications: No notable events documented.

## 2024-05-22 NOTE — Op Note (Signed)
 [facility] Patient Name: Danielle Vasquez Procedure Date: 05/22/2024 2:32 PM MRN: [id number] Date of Birth: 04/07/1929 Attending MD: Toribio Fortune , , [id number] CSN: [id number] Age: 89 Admit Type: Inpatient Procedure:                Colonoscopy Indications:              Rectal bleeding Providers:                Toribio Fortune, Crystal Page, Rosina Sprague Referring MD:              Medicines:                Monitored Anesthesia Care Complications:            No immediate complications. Estimated Blood Loss:     Estimated blood loss: none. Procedure:                Pre-Anesthesia Assessment:                           - Prior to the procedure, a History and Physical                            was performed, and patient medications, allergies                            and sensitivities were reviewed. The patient's                            tolerance of previous anesthesia was reviewed.                           - The risks and benefits of the procedure and the                            sedation options and risks were discussed with the                            patient. All questions were answered and informed                            consent was obtained.                           - ASA Grade Assessment: III - A patient with severe                            systemic disease.                           After obtaining informed consent, the colonoscope                            was passed under direct vision. Throughout the                            procedure, the patient's blood pressure, pulse, and  oxygen saturations were monitored continuously. The                            [id number] ([id number]) Peds Colon was introduced                            through the anus and advanced to the the cecum,                            identified by appendiceal orifice and ileocecal                            valve. The colonoscopy was performed without                             difficulty. The patient tolerated the procedure                            well. The quality of the bowel preparation was good. Scope In: 2:48:47 PM Scope Out: 3:09:34 PM Scope Withdrawal Time: 0 hours 12 minutes 14 seconds  Total Procedure Duration: 0 hours 20 minutes 47 seconds  Findings:      The perianal and digital rectal examinations were normal.      A 4 mm polyp was found in the transverse colon. The polyp was sessile.       The polyp was removed with a cold snare. Resection and retrieval were       complete.      Segmental moderate inflammation characterized by congestion (edema),       erythema, granularity and linear erosions was found from 10 to 20 cm       proximal to the anus. Biopsies were taken with a cold forceps for       histology.      The retroflexed view of the distal rectum and anal verge was normal and       showed no anal or rectal abnormalities. Impression:               - One 4 mm polyp in the transverse colon, removed                            with a cold snare. Resected and retrieved.                           - Segmental moderate inflammation was found from 10                            to 20 cm proximal to the anus secondary to possible                            ischemic colitis. Biopsied.                           - The distal rectum and anal verge are normal on  retroflexion view. Moderate Sedation:      Per Anesthesia Care Recommendation:           - Return patient to hospital ward for ongoing care.                           - Resume previous diet.                           - Await pathology results.                           - Start Augmentin  BID for 5 days.                           - Daily H/H Procedure Code(s):        --- Professional ---                           [phone], Colonoscopy, flexible; with removal of                            tumor(s), polyp(s), or other lesion(s) by snare                             technique                           [id number], 59, Colonoscopy, flexible; with biopsy,                            single or multiple Diagnosis Code(s):        --- Professional ---                           K62.5, Hemorrhage of anus and rectum CPT copyright 2022 [employer]. All rights reserved. The codes documented in this report are preliminary and upon coder review may  be revised to meet current compliance requirements. Toribio Fortune, MD Toribio Fortune,  05/22/2024 3:29:16 PM This report has been signed electronically. Number of Addenda: 0

## 2024-05-22 NOTE — Progress Notes (Signed)
We will proceed with colonoscopy as scheduled.  I thoroughly discussed with the patient the procedure, including the risks involved. Patient understands what the procedure involves including the benefits and any risks. Patient understands alternatives to the proposed procedure. Risks including (but not limited to) bleeding, tearing of the lining (perforation), rupture of adjacent organs, problems with heart and lung function, infection, and medication reactions. A small percentage of complications may require surgery, hospitalization, repeat endoscopic procedure, and/or transfusion.  Patient understood and agreed.  Danielle Peppers, MD Gastroenterology and Hepatology [facility]

## 2024-05-22 NOTE — Plan of Care (Signed)

## 2024-05-22 NOTE — Care Management Obs Status (Signed)
 MEDICARE OBSERVATION STATUS NOTIFICATION   Patient Details  Name: Danielle Vasquez MRN: [id number] Date of Birth: 1929-05-21   Medicare Observation Status Notification Given:  Yes    Hoy DELENA Bigness, LCSW 05/22/2024, 9:05 AM

## 2024-05-22 NOTE — Progress Notes (Addendum)
 Pt successfully completed bowel prep with multiple episodes of liquid, yellow-brown stool consistent with adequate cleansing. Stool currently light yellow and clear; enema not required. Pt has remained NPO since 01:00. The informed consent has not yet been finalized. Pt and family requested further clarification from the surgeon regarding risks and benefits prior to proceeding. Charge RN has been notified of patient and family's concerns.

## 2024-05-22 NOTE — Anesthesia Preprocedure Evaluation (Signed)
 Anesthesia Evaluation  Patient identified by MRN, date of birth, ID band Patient awake    Reviewed: Allergy & Precautions, H&P , NPO status , Patient's Chart, lab work & pertinent test results, reviewed documented beta blocker date and time   Airway Mallampati: II  TM Distance: >3 FB Neck ROM: full    Dental no notable dental hx.    Pulmonary neg pulmonary ROS   Pulmonary exam normal breath sounds clear to auscultation       Cardiovascular Exercise Tolerance: Good hypertension, + dysrhythmias  Rhythm:regular Rate:Normal     Neuro/Psych  Neuromuscular disease  negative psych ROS   GI/Hepatic Neg liver ROS,GERD  ,,  Endo/Other  negative endocrine ROS    Renal/GU negative Renal ROS  negative genitourinary   Musculoskeletal   Abdominal   Peds  Hematology negative hematology ROS (+)   Anesthesia Other Findings   Reproductive/Obstetrics negative OB ROS                              Anesthesia Physical Anesthesia Plan  ASA: 3 and emergent  Anesthesia Plan: General   Post-op Pain Management:    Induction:   PONV Risk Score and Plan: Propofol  infusion  Airway Management Planned:   Additional Equipment:   Intra-op Plan:   Post-operative Plan:   Informed Consent: I have reviewed the patients History and Physical, chart, labs and discussed the procedure including the risks, benefits and alternatives for the proposed anesthesia with the patient or authorized representative who has indicated his/her understanding and acceptance.     Dental Advisory Given  Plan Discussed with: CRNA  Anesthesia Plan Comments:         Anesthesia Quick Evaluation

## 2024-05-23 DIAGNOSIS — K625 Hemorrhage of anus and rectum: Secondary | ICD-10-CM | POA: Diagnosis not present

## 2024-05-23 DIAGNOSIS — K559 Vascular disorder of intestine, unspecified: Secondary | ICD-10-CM

## 2024-05-23 LAB — BASIC METABOLIC PANEL WITH GFR
Anion gap: 7 (ref 5–15)
BUN: 11 mg/dL (ref 8–23)
CO2: 24 mmol/L (ref 22–32)
Calcium: 8.5 mg/dL — ABNORMAL LOW (ref 8.9–10.3)
Chloride: 109 mmol/L (ref 98–111)
Creatinine, Ser: 0.69 mg/dL (ref 0.44–1.00)
GFR, Estimated: >60 mL/min (ref >60)
Glucose, Bld: 98 mg/dL (ref 70–99)
Potassium: 3.6 mmol/L (ref 3.5–5.1)
Sodium: 140 mmol/L (ref 135–145)

## 2024-05-23 LAB — CBC WITH DIFFERENTIAL/PLATELET
Abs Immature Granulocytes: 0.02 K/uL (ref 0.00–0.07)
Basophils Absolute: 0.1 K/uL (ref 0.0–0.1)
Basophils Relative: 1 %
Eosinophils Absolute: 0.2 K/uL (ref 0.0–0.5)
Eosinophils Relative: 3 %
HCT: 36.8 % (ref 36.0–46.0)
Hemoglobin: 12.1 g/dL (ref 12.0–15.0)
Immature Granulocytes: 0 %
Lymphocytes Relative: 22 %
Lymphs Abs: 1.7 K/uL (ref 0.7–4.0)
MCH: 31.1 pg (ref 26.0–34.0)
MCHC: 32.9 g/dL (ref 30.0–36.0)
MCV: 94.6 fL (ref 80.0–100.0)
Monocytes Absolute: 0.8 K/uL (ref 0.1–1.0)
Monocytes Relative: 10 %
Neutro Abs: 4.8 K/uL (ref 1.7–7.7)
Neutrophils Relative %: 64 %
Platelets: 151 K/uL (ref 150–400)
RBC: 3.89 MIL/uL (ref 3.87–5.11)
RDW: 12.4 % (ref 11.5–15.5)
WBC: 7.6 K/uL (ref 4.0–10.5)
nRBC: 0 % (ref 0.0–0.2)

## 2024-05-23 LAB — MAGNESIUM: Magnesium: 1.5 mg/dL — ABNORMAL LOW (ref 1.7–2.4)

## 2024-05-23 MED ORDER — AMOXICILLIN-POT CLAVULANATE 875-125 MG PO TABS: 1.0000 | ORAL_TABLET | Freq: Two times a day (BID) | ORAL | 0 refills | Status: AC

## 2024-05-23 MED ORDER — POLYETHYLENE GLYCOL 3350 17 G PO PACK: 17.0000 g | PACK | Freq: Every day | ORAL | 0 refills | Status: AC | PRN

## 2024-05-23 MED ORDER — MAGNESIUM SULFATE 2 GM/50ML IV SOLN
2.0000 g | Freq: Once | INTRAVENOUS | Status: AC
  Administered 2024-05-23: 2 g via INTRAVENOUS
  Filled 2024-05-23: qty 50

## 2024-05-23 NOTE — Discharge Summary (Signed)
 Physician Discharge Summary  Danielle Vasquez FMW:[id number] DOB: 10/21/1928 DOA: 05/20/2024  PCP: Shona Norleen PEDLAR, MD  Admit date: 05/20/2024 Discharge date: 05/23/2024  Admitted From: Home Disposition: Home  Recommendations for Outpatient Follow-up:  Follow up with PCP in 1 week with repeat CBC/BMP Outpatient follow-up with GI Follow up in ED if symptoms worsen or new appear   Home Health: No Equipment/Devices: None  Discharge Condition: Stable CODE STATUS: Full Diet recommendation: Heart healthy  Brief/Interim Summary: 89 year old female with history of paroxysmal A-fib on chronic anticoagulation, fibromyalgia presented with rectal bleeding. On presentation, hemoglobin was 16.1, WBC of 19.5, lactic acid of 1.8 and 2.6. CTA abdomen and pelvis with contrast showed no findings to suggest GI bleeding. GI was consulted.  She underwent colonoscopy on 05/22/2024 which showed possible ischemic colitis.  Subsequently hemoglobin has remained stable.  GI recommended Augmentin  for 5 days.  GI has cleared the patient for discharge.  She is tolerating diet well.  She will be discharged home today.  Outpatient follow-up with GI.  Discharge Diagnoses:   Possible lower GI bleeding presenting with rectal bleeding - Imaging as above.  Hemoglobin has remained stable.   -  She underwent colonoscopy on 05/22/2024 which showed possible ischemic colitis.  Subsequently hemoglobin has remained stable.  GI recommended Augmentin  for 5 days.  GI has cleared the patient for discharge.  She is tolerating diet well.  She will be discharged home today.  Outpatient follow-up with GI.   Hypertension -Stable.  Resume lisinopril and metoprolol .  Hold Norvasc  till reevaluation by PCP.   Paroxysmal A-fib - Currently rate controlled.  Eliquis  on hold  Hypomagnesemia - Replace prior to discharge   Leukocytosis - Possibly reactive.  Resolved.   AKI - Baseline creatinine of 0.8.  Creatinine 1.45 on presentation.   Resolved  Discharge Instructions  Discharge Instructions     Diet - low sodium heart healthy   Complete by: As directed    Increase activity slowly   Complete by: As directed       Allergies as of 05/23/2024       Reactions   Diclofenac Other (See Comments)   Unknown    Atorvastatin  Dermatitis        Medication List     TAKE these medications    acetaminophen  500 MG tablet Commonly known as: TYLENOL  Take 500 mg by mouth every 6 (six) hours as needed for mild pain (pain score 1-3).   amLODipine  2.5 MG tablet Commonly known as: NORVASC  Take 2.5 mg by mouth daily.   amoxicillin -clavulanate 875-125 MG tablet Commonly known as: AUGMENTIN  Take 1 tablet by mouth every 12 (twelve) hours for 5 days.   Eliquis  2.5 MG Tabs tablet Generic drug: apixaban  TAKE 1 TABLET TWICE A DAY   Fish Oil 1200 MG Caps Take 1,200 mg by mouth daily.   lisinopril 20 MG tablet Commonly known as: ZESTRIL Take 20 mg by mouth daily.   loperamide HCl 1 MG/7.5ML solution Commonly known as: IMODIUM Take 30 mLs by mouth 4 (four) times daily as needed for diarrhea or loose stools.   loteprednol 0.5 % ophthalmic suspension Commonly known as: LOTEMAX Place 1 drop into both eyes 4 (four) times daily.   metoprolol  tartrate 25 MG tablet Commonly known as: LOPRESSOR  Take 25 mg by mouth daily at 6 (six) AM.   pantoprazole  20 MG tablet Commonly known as: PROTONIX  Take 20 mg by mouth every morning.   polyethylene glycol 17 g packet Commonly known as: MIRALAX  /  GLYCOLAX  Take 17 g by mouth daily as needed for mild constipation.   Vitamin D 1000 units capsule Take 1,000 Units by mouth daily.        Follow-up Information     Shona Norleen PEDLAR, MD. Schedule an appointment as soon as possible for a visit in 1 week(s).   Specialty: Internal Medicine Why: with cbc/bmp Contact information: [address] [place] [address] [phone]                Allergies  Allergen  Reactions   Diclofenac Other (See Comments)    Unknown    Atorvastatin  Dermatitis    Consultations: GI   Procedures/Studies: CT Angio Abd/Pel W and/or Wo Contrast Result Date: 05/20/2024 CLINICAL DATA:  Abdominal pain and bloody stools, initial encounter EXAM: CTA ABDOMEN AND PELVIS WITHOUT AND WITH CONTRAST TECHNIQUE: Multidetector CT imaging of the abdomen and pelvis was performed using the standard protocol during bolus administration of intravenous contrast. Multiplanar reconstructed images and MIPs were obtained and reviewed to evaluate the vascular anatomy. RADIATION DOSE REDUCTION: This exam was performed according to the departmental dose-optimization program which includes automated exposure control, adjustment of the mA and/or kV according to patient size and/or use of iterative reconstruction technique. CONTRAST:  75mL OMNIPAQUE  IOHEXOL  350 MG/ML SOLN COMPARISON:  None Available. FINDINGS: VASCULAR Aorta: Abdominal aorta demonstrates scattered atherosclerotic calcifications without aneurysmal dilatation or dissection. 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 The vessel is otherwise patent. SMA: Patent without evidence of aneurysm, dissection, vasculitis or significant stenosis. Renals: Both renal arteries are patent without evidence of aneurysm, dissection, vasculitis, fibromuscular dysplasia or significant stenosis. IMA: Patent without evidence of aneurysm, dissection, vasculitis or significant stenosis. Inflow: Iliacs demonstrate atherosclerotic calcification without aneurysmal dilatation or dissection. Veins: No specific venous abnormality is noted. Review of the MIP images confirms the above findings. NON-VASCULAR Lower chest: No acute abnormality. Hepatobiliary: Mild fatty infiltration of the liver is noted. The gallbladder has been surgically removed. Pancreas: Unremarkable. No pancreatic ductal dilatation or surrounding inflammatory changes. Spleen: Normal in  size without focal abnormality. Adrenals/Urinary Tract: Adrenal glands are within normal limits. Kidneys demonstrate a normal enhancement pattern. Right renal cysts are seen which appears simple in nature. No follow-up is recommended. No renal calculi or obstructive changes are seen. The bladder is decompressed. Stomach/Bowel: The appendix is within normal limits. No obstructive or inflammatory changes of the colon are seen. No findings to suggest active lower GI hemorrhage are noted. Small bowel and stomach are within normal limits. No active hemorrhage is seen. Lymphatic: No lymphadenopathy is noted. Reproductive: Uterus and bilateral adnexa are unremarkable. Other: No abdominal wall hernia or abnormality. No abdominopelvic ascites. Musculoskeletal: Degenerative changes of lumbar spine are noted. IMPRESSION: VASCULAR Atherosclerotic calcifications are noted. No findings to suggest active GI hemorrhage are noted. 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 NON-VASCULAR Fatty infiltration of the liver. No other focal abnormality is noted. Electronically Signed   By: Oneil Devonshire M.D.   On: 05/20/2024 21:19   Colonoscopy on 05/22/2024 FINDINGS: - One 4 mm polyp in the transverse colon, removed with a cold snare.  Resected and retrieved.  - Segmental moderate inflammation was found from 10 to 20 cm proximal to the anus secondary to possible ischemic colitis.  Biopsied.  - The distal rectum and anal verge are normal on retroflexion view.    RECOMMENDATIONS - Return patient to hospital ward for ongoing care.  -  Resume previous diet.  - Await pathology results.  - Start Augmentin  BID for 5 days. - Daily H/H   Subjective: Patient seen and examined at bedside.  No fever, vomiting, worsening abdominal pain reported.  Denies any more rectal bleeding.  Discharge Exam: Vitals:   05/23/24 0031 05/23/24 0526  BP: (!) 132/53 (!) 162/75  Pulse: 70 80  Resp: 14 16  Temp:  98.6 F (37 C) 98.2 F (36.8 C)  SpO2: 96% 93%    General: Pt is alert, awake, not in acute distress.  Elderly female lying in bed. Cardiovascular: rate controlled, S1/S2 + Respiratory: bilateral decreased breath sounds at bases Abdominal: Soft, NT, ND, bowel sounds + Extremities: no edema, no cyanosis    The results of significant diagnostics from this hospitalization (including imaging, microbiology, ancillary and laboratory) are listed below for reference.     Microbiology: No results found for this or any previous visit (from the past 240 hours).   Labs: BNP (last 3 results) No results for input(s): BNP in the last 8760 hours. Basic Metabolic Panel: Recent Labs  Lab 05/20/24 1749 05/21/24 0345 05/22/24 0302 05/23/24 0311  NA 136 136 137 140  K 4.8 4.9 4.4 3.6  CL 99 105 107 109  CO2 23 24 23 24   GLUCOSE 177* 137* 99 98  BUN 28* 29* 15 11  CREATININE 1.45* 1.28* 0.76 0.69  CALCIUM  9.7 8.5* 8.7* 8.5*  MG  --   --  1.8 1.5*   Liver Function Tests: Recent Labs  Lab 05/20/24 1749  AST 30  ALT 18  ALKPHOS 71  BILITOT 1.1  PROT 6.9  ALBUMIN 4.1   No results for input(s): LIPASE, AMYLASE in the last 168 hours. No results for input(s): AMMONIA in the last 168 hours. CBC: Recent Labs  Lab 05/20/24 1749 05/20/24 2335 05/21/24 0345 05/21/24 1029 05/21/24 1604 05/22/24 0302 05/23/24 0311  WBC 19.5*  --  12.7*  --   --  10.8* 7.6  NEUTROABS 17.5*  --   --   --   --  7.9* 4.8  HGB 16.1*   < > 13.5 13.5 12.9 13.0 12.1  HCT 48.1*   < > 39.8 40.9 38.7 39.7 36.8  MCV 94.1  --  93.4  --   --  95.4 94.6  PLT 195  --  177  --   --  156 151   < > = values in this interval not displayed.   Cardiac Enzymes: No results for input(s): CKTOTAL, CKMB, CKMBINDEX, TROPONINI in the last 168 hours. BNP: Invalid input(s): POCBNP CBG: No results for input(s): GLUCAP in the last 168 hours. D-Dimer No results for input(s): DDIMER in the last 72  hours. Hgb A1c No results for input(s): HGBA1C in the last 72 hours. Lipid Profile No results for input(s): CHOL, HDL, LDLCALC, TRIG, CHOLHDL, LDLDIRECT in the last 72 hours. Thyroid  function studies No results for input(s): TSH, T4TOTAL, T3FREE, THYROIDAB in the last 72 hours.  Invalid input(s): FREET3 Anemia work up No results for input(s): VITAMINB12, FOLATE, FERRITIN, TIBC, IRON, RETICCTPCT in the last 72 hours. Urinalysis    Component Value Date/Time   COLORURINE YELLOW 04/21/2017 0441   APPEARANCEUR CLEAR 04/21/2017 0441   LABSPEC 1.012 04/21/2017 0441   PHURINE 5.0 04/21/2017 0441   GLUCOSEU NEGATIVE 04/21/2017 0441   HGBUR NEGATIVE 04/21/2017 0441   BILIRUBINUR NEGATIVE 04/21/2017 0441   KETONESUR NEGATIVE 04/21/2017 0441   PROTEINUR NEGATIVE 04/21/2017 0441   NITRITE NEGATIVE 04/21/2017 0441  LEUKOCYTESUR NEGATIVE 04/21/2017 0441   Sepsis Labs Recent Labs  Lab 05/20/24 1749 05/21/24 0345 05/22/24 0302 05/23/24 0311  WBC 19.5* 12.7* 10.8* 7.6   Microbiology No results found for this or any previous visit (from the past 240 hours).   Time coordinating discharge: 35 minutes  SIGNED:   Sophie Mao, MD  [facility] 05/23/2024, 8:27 AM

## 2024-05-23 NOTE — Progress Notes (Signed)
 Toribio Fortune, M.D. Gastroenterology & Hepatology   Interval History:  Patient reports feeling well.  Denies any complaint such as abdominal pain, nausea, fever, chills, melena or hematochezia. Tolerated diet adequately.  Not presenting any more diarrhea.  Inpatient Medications:  Current Facility-Administered Medications:    acetaminophen  (TYLENOL ) tablet 650 mg, 650 mg, Oral, Q6H PRN **OR** acetaminophen  (TYLENOL ) suppository 650 mg, 650 mg, Rectal, Q6H PRN, Emokpae, Ejiroghene E, MD   amoxicillin -clavulanate (AUGMENTIN ) 875-125 MG per tablet 1 tablet, 1 tablet, Oral, Q12H, Castaneda Mayorga, Toribio, MD, 1 tablet at 05/23/24 9094   hydrocortisone  cream 1 %, , Topical, BID, Cheryle, Kshitiz, MD, Given at 05/22/24 0504   magnesium  sulfate IVPB 2 g 50 mL, 2 g, Intravenous, Once, Alekh, Kshitiz, MD, Last Rate: 50 mL/hr at 05/23/24 0913, 2 g at 05/23/24 0913   ondansetron  (ZOFRAN ) tablet 4 mg, 4 mg, Oral, Q6H PRN **OR** ondansetron  (ZOFRAN ) injection 4 mg, 4 mg, Intravenous, Q6H PRN, Emokpae, Ejiroghene E, MD   pantoprazole  (PROTONIX ) injection 40 mg, 40 mg, Intravenous, Q24H, Emokpae, Ejiroghene E, MD, 40 mg at 05/22/24 2123   polyethylene glycol (MIRALAX  / GLYCOLAX ) packet 17 g, 17 g, Oral, Daily PRN, Emokpae, Ejiroghene E, MD   I/O    Intake/Output Summary (Last 24 hours) at 05/23/2024 0956 Last data filed at 05/23/2024 0601 Gross per 24 hour  Intake 1293.99 ml  Output --  Net 1293.99 ml     Physical Exam: Temp:  [97.7 F (36.5 C)-99 F (37.2 C)] 98.2 F (36.8 C) (09/20 0526) Pulse Rate:  [70-97] 80 (09/20 0526) Resp:  [14-25] 16 (09/20 0526) BP: (132-188)/(53-83) 162/75 (09/20 0526) SpO2:  [93 %-97 %] 93 % (09/20 0526)  Temp (24hrs), Avg:98.5 F (36.9 C), Min:97.7 F (36.5 C), Max:99 F (37.2 C)  GENERAL: The patient is AO x3, in no acute distress. HEENT: Head is normocephalic and atraumatic. EOMI are intact. Mouth is well hydrated and without lesions. NECK: Supple. No  masses LUNGS: Clear to auscultation. No presence of rhonchi/wheezing/rales. Adequate chest expansion HEART: RRR, normal s1 and s2. ABDOMEN: Soft, nontender, no guarding, no peritoneal signs, and nondistended. BS +. No masses. EXTREMITIES: Without any cyanosis, clubbing, rash, lesions or edema. NEUROLOGIC: AOx3, no focal motor deficit. SKIN: no jaundice, no rashes  Laboratory Data: CBC:     Component Value Date/Time   WBC 7.6 05/23/2024 0311   RBC 3.89 05/23/2024 0311   HGB 12.1 05/23/2024 0311   HGB 14.3 04/14/2018 0955   HCT 36.8 05/23/2024 0311   HCT 43.1 04/14/2018 0955   PLT 151 05/23/2024 0311   PLT 174 04/14/2018 0955   MCV 94.6 05/23/2024 0311   MCV 94 04/14/2018 0955   MCH 31.1 05/23/2024 0311   MCHC 32.9 05/23/2024 0311   RDW 12.4 05/23/2024 0311   RDW 13.3 04/14/2018 0955   LYMPHSABS 1.7 05/23/2024 0311   MONOABS 0.8 05/23/2024 0311   EOSABS 0.2 05/23/2024 0311   BASOSABS 0.1 05/23/2024 0311   COAG: No results found for: INR, PROTIME  BMP:     Latest Ref Rng & Units 05/23/2024    3:11 AM 05/22/2024    3:02 AM 05/21/2024    3:45 AM  BMP  Glucose 70 - 99 mg/dL 98  99  862   BUN 8 - 23 mg/dL 11  15  29    Creatinine 0.44 - 1.00 mg/dL 9.30  9.23  8.71   Sodium 135 - 145 mmol/L 140  137  136   Potassium 3.5 - 5.1 mmol/L  3.6  4.4  4.9   Chloride 98 - 111 mmol/L 109  107  105   CO2 22 - 32 mmol/L 24  23  24    Calcium  8.9 - 10.3 mg/dL 8.5  8.7  8.5     HEPATIC:     Latest Ref Rng & Units 05/20/2024    5:49 PM 03/13/2021    9:05 AM 05/10/2020   10:50 AM  Hepatic Function  Total Protein 6.5 - 8.1 g/dL 6.9   7.1   Albumin 3.5 - 5.0 g/dL 4.1   4.2   AST 15 - 41 U/L 30  21  21    ALT 0 - 44 U/L 18   15   Alk Phosphatase 38 - 126 U/L 71   69   Total Bilirubin 0.0 - 1.2 mg/dL 1.1   1.0   Bilirubin, Direct 0.0 - 0.2 mg/dL   0.1     CARDIAC:  Lab Results  Component Value Date   TROPONINI <0.03 04/21/2017      Imaging: I personally reviewed and interpreted the  available labs, imaging and endoscopic files.   Assessment/Plan: 89 year old female with A-fib on Eliquis , fibromyalgia, hypertension presented with fresh blood per rectum. GI is consulted for lower GI bleed and diarrhea.  Patient presented dropping hemoglobin from baseline while on Eliquis .  Due to this, colonoscopy was performed yesterday, which showed presence of inflammation between 20 to 10 cm proximal to the anus which was concerning for ischemic colitis (pathology pending).  Polypectomy was performed for a polyp in the transverse colon.  Reassuringly, no active bleeding or any other abnormalities were found.  After colonoscopy was performed, patient has been doing well and denies any complaints without any other signs of overt gastrointestinal bleeding with stable hemoglobin.  The patient presented with acute onset of diarrhea that has resolved.  She likely developed acute infectious gastroenteritis that has resolved at this point.  Unfortunately, she developed ischemic colitis due to dehydration.  At this point, she will need to finish a 5-day course of Augmentin  for management of ischemic colitis.  I also advised her to keep hydrated if she were to present recurrent diarrhea in the future.  - Advance diet as tolerated - Augmentin  twice daily for total 5 days - Follow-up pathology results - GI service will sign-off, please call us  back if you have any more questions.  Toribio Fortune, MD Gastroenterology and Hepatology [facility]

## 2024-05-25 ENCOUNTER — Encounter (HOSPITAL_COMMUNITY): Payer: Self-pay | Admitting: Gastroenterology

## 2024-05-25 ENCOUNTER — Telehealth: Payer: Self-pay

## 2024-05-25 NOTE — Transitions of Care (Post Inpatient/ED Visit) (Signed)
   05/25/2024  Name: OMYA WINFIELD MRN: [id number] DOB: 1929-03-23  Today's TOC FU Call Status: Today's TOC FU Call Status:: Unsuccessful Call (1st Attempt) Unsuccessful Call (1st Attempt) Date: 05/25/24  Attempted to reach the patient regarding the most recent Inpatient/ED visit.  Follow Up Plan: Additional outreach attempts will be made to reach the patient to complete the Transitions of Care (Post Inpatient/ED visit) call.   Delmont Prosch J. Ayaana Biondo RN, MSN [facility], [facility] RN Care Manager Direct Dial: [phone]  Fax: [phone] Website: [email or web address]

## 2024-05-26 ENCOUNTER — Ambulatory Visit (INDEPENDENT_AMBULATORY_CARE_PROVIDER_SITE_OTHER): Payer: Self-pay | Admitting: Gastroenterology

## 2024-05-26 ENCOUNTER — Telehealth: Payer: Self-pay

## 2024-05-26 LAB — SURGICAL PATHOLOGY

## 2024-05-26 NOTE — Transitions of Care (Post Inpatient/ED Visit) (Signed)
   05/26/2024  Name: Danielle Vasquez MRN: [id number] DOB: 06/10/1929  Today's TOC FU Call Status: Today's TOC FU Call Status:: Unsuccessful Call (2nd Attempt) Unsuccessful Call (2nd Attempt) Date: 05/26/24  Attempted to reach the patient regarding the most recent Inpatient/ED visit.  Follow Up Plan: Additional outreach attempts will be made to reach the patient to complete the Transitions of Care (Post Inpatient/ED visit) call.   Ly Bacchi J. Vartan Kerins RN, MSN [facility], [facility] RN Care Manager Direct Dial: [phone]  Fax: [phone] Website: [email or web address]

## 2024-05-27 ENCOUNTER — Telehealth: Payer: Self-pay

## 2024-05-27 NOTE — Transitions of Care (Post Inpatient/ED Visit) (Signed)
   05/27/2024  Name: Danielle Vasquez MRN: [id number] DOB: [birth year]  Today's TOC FU Call Status: Today's TOC FU Call Status:: Unsuccessful Call (3rd Attempt) Unsuccessful Call (3rd Attempt) Date: 05/27/24  Attempted to reach the patient regarding the most recent Inpatient/ED visit.  Follow Up Plan: No further outreach attempts will be made at this time. We have been unable to contact the patient.  Jeremyah Jelley J. Jahaziel Francois RN, MSN [facility], [facility] RN Care Manager Direct Dial: [phone]  Fax: [phone] Website: [email or web address]

## 2024-05-29 NOTE — Anesthesia Postprocedure Evaluation (Signed)
 Anesthesia Post Note  Patient: Danielle Vasquez  Procedure(s) Performed: COLONOSCOPY  Patient location during evaluation: Phase II Anesthesia Type: General Level of consciousness: awake Pain management: pain level controlled Vital Signs Assessment: post-procedure vital signs reviewed and stable Respiratory status: spontaneous breathing and respiratory function stable Cardiovascular status: blood pressure returned to baseline and stable Postop Assessment: no headache and no apparent nausea or vomiting Anesthetic complications: no Comments: Late entry   No notable events documented.   Last Vitals:  Vitals:   05/23/24 0526 05/23/24 1435  BP: (!) 162/75 (!) 151/62  Pulse: 80 86  Resp: 16 18  Temp: 36.8 C 37 C  SpO2: 93% 98%    Last Pain:  Vitals:   05/23/24 1435  TempSrc: Oral  PainSc:                  Danielle Vasquez

## 2024-06-01 DIAGNOSIS — I480 Paroxysmal atrial fibrillation: Secondary | ICD-10-CM | POA: Diagnosis not present

## 2024-06-01 DIAGNOSIS — G720 Drug-induced myopathy: Secondary | ICD-10-CM | POA: Diagnosis not present

## 2024-06-01 DIAGNOSIS — I10 Essential (primary) hypertension: Secondary | ICD-10-CM | POA: Diagnosis not present

## 2024-06-01 DIAGNOSIS — K922 Gastrointestinal hemorrhage, unspecified: Secondary | ICD-10-CM | POA: Diagnosis not present

## 2024-06-01 DIAGNOSIS — M179 Osteoarthritis of knee, unspecified: Secondary | ICD-10-CM | POA: Diagnosis not present

## 2024-06-01 DIAGNOSIS — E782 Mixed hyperlipidemia: Secondary | ICD-10-CM | POA: Diagnosis not present

## 2024-06-01 DIAGNOSIS — R944 Abnormal results of kidney function studies: Secondary | ICD-10-CM | POA: Diagnosis not present

## 2024-06-01 DIAGNOSIS — K219 Gastro-esophageal reflux disease without esophagitis: Secondary | ICD-10-CM | POA: Diagnosis not present

## 2024-06-01 DIAGNOSIS — K559 Vascular disorder of intestine, unspecified: Secondary | ICD-10-CM | POA: Diagnosis not present

## 2024-06-01 DIAGNOSIS — Z0001 Encounter for general adult medical examination with abnormal findings: Secondary | ICD-10-CM | POA: Diagnosis not present

## 2024-06-01 DIAGNOSIS — E1369 Other specified diabetes mellitus with other specified complication: Secondary | ICD-10-CM | POA: Diagnosis not present

## 2024-06-02 NOTE — Progress Notes (Signed)
 Patient result letter mailed

## 2024-07-08 ENCOUNTER — Encounter (INDEPENDENT_AMBULATORY_CARE_PROVIDER_SITE_OTHER): Payer: Self-pay | Admitting: Gastroenterology

## 2024-10-06 ENCOUNTER — Ambulatory Visit: Admitting: Physician Assistant

## 2024-10-06 ENCOUNTER — Encounter: Payer: Self-pay | Admitting: Physician Assistant

## 2024-10-06 VITALS — BP 136/68 | HR 61 | Ht 61.0 in | Wt 129.2 lb

## 2024-10-06 DIAGNOSIS — I480 Paroxysmal atrial fibrillation: Secondary | ICD-10-CM

## 2024-10-06 DIAGNOSIS — E785 Hyperlipidemia, unspecified: Secondary | ICD-10-CM

## 2024-10-06 DIAGNOSIS — I10 Essential (primary) hypertension: Secondary | ICD-10-CM

## 2024-10-06 NOTE — Patient Instructions (Addendum)
 Medication Instructions:  Your physician recommends that you continue on your current medications as directed. Please refer to the Current Medication list given to you today.  *If you need a refill on your cardiac medications before your next appointment, please call your pharmacy*  Lab Work: None If you have labs (blood work) drawn today and your tests are completely normal, you will receive your results only by: MyChart Message (if you have MyChart) OR A paper copy in the mail If you have any lab test that is abnormal or we need to change your treatment, we will call you to review the results.  Testing/Procedures: None  Follow-Up: At [facility], you and your health needs are our priority.  As part of our continuing mission to provide you with exceptional heart care, our providers are all part of one team.  This team includes your primary Cardiologist (physician) and Advanced Practice Providers or APPs (Physician Assistants and Nurse Practitioners) who all work together to provide you with the care you need, when you need it.  Your next appointment:   1 year(s)  Provider:   You may see Vina Gull, MD or one of the following Advanced Practice Providers on your designated Care Team:   Laymon Qua, PA-C  [place], [place] Olivia [place]avy, [place]     We recommend signing up for the patient portal called MyChart.  Sign up information is provided on this After Visit Summary.  MyChart is used to connect with patients for Virtual Visits (Telemedicine).  Patients are able to view lab/test results, encounter notes, upcoming appointments, etc.  Non-urgent messages can be sent to your provider as well.   To learn more about what you can do with MyChart, go to [email or web address].   Other Instructions            Limiting Fat and Cholesterol: Eating Plan Eating a diet that's low in fat and cholesterol may help lower your risk for heart disease and other heart  problems. Your body needs some fat and cholesterol to work right. But eating too much of those things can be harmful to your health. You may need to have tests done to check your blood fat and cholesterol levels. Your health care provider may help you make an eating plan that includes: What are tips for following this plan? General guidelines Work with your provider to lose weight if you need to. Losing just 5-10% of your body weight can help your health. If you have diabetes, work to keep your blood sugar in a healthy range. High blood sugar can damage the inside of blood vessels. This damage can attract fats and cholesterol. It can cause a blockage. Avoid: Fried foods. Foods with partially hydrogenated oils. These include: Stick margarine and some tub margarines. Cookies. Crackers. Baked goods. Foods with added sugar. If you drink alcohol: Limit how much you have to: 0-1 drink a day if you're female. 0-2 drinks a day if you're female. Know how much alcohol is in your drink. In the [place] one drink is one 12 oz bottle of beer (355 mL), one 5 oz glass of wine (148 mL), or one 1 oz glass of hard liquor (44 mL). Reading food labels Check food labels for: Trans fat. Partially hydrogenated oils. Saturated fat (g) in each serving. Choose foods that have less than 2 g of saturated fat. Cholesterol (mg) in each serving. Fiber (g) in each serving. Choose foods with healthy fats, such as: Monounsaturated fats and polyunsaturated fats. These  include: Olive and canola oil. Flaxseeds. Walnuts and almonds. Seeds. Omega-3 fats. These are found in foods such as: Salmon, mackerel, sardines, and tuna. Flaxseed oil. Ground flaxseeds. Choose grain products that have whole grains. Look for the word whole as the first word on the label. Cooking Cook foods using low-fat methods. These include baking, boiling, grilling, and broiling. Eat more home-cooked foods. Eat at restaurants and buffets less  often. Eat less fast food. Avoid cooking using saturated fats, such as: Butter and cream. Palm oil. Palm kernel oil. Coconut oil. Meal planning  At meals, divide your plate into 4 equal parts: Fill half of your plate with vegetables, green salads, and fruit. Fill a fourth of your plate with whole grains. Fill a fourth of your plate with low-fat (lean) proteins. Eat fish that's high in omega-3 fats at least twice a week. Eat foods that are high in fiber, such as: Whole grains. Beans. Apples, pears, and berries. Broccoli, carrots, and peas. Barley. What foods should I eat? Fruits All fresh, canned (in natural juice), or frozen fruits. Vegetables Fresh or frozen vegetables. Green salads. Grains Whole grains, such as: Whole wheat or whole grain breads. Crackers. Cereals. Pasta. Unsweetened oatmeal, bulgur, barley, quinoa, or brown rice. Corn or whole wheat flour tortillas. Meats and other proteins Ground beef (85% or leaner), grass-fed beef, or beef trimmed of fat. Skinless chicken or turkey. Ground chicken or turkey. Pork trimmed of fat. All fish and seafood. Egg whites. Dried beans, peas, or lentils. Unsalted canned beans. Unsalted nuts or seeds. Natural nut butters without added sugar and oil. Dairy Low-fat or nonfat dairy products, such as skim or 1% milk. Reduced-fat cheeses, low-fat and fat-free ricotta, or cottage cheese. Plain low-fat and nonfat yogurt. Fats and oils Tub margarine without trans fats. Light or reduced-fat mayonnaise and salad dressings. Avocado, olive, canola, sesame, or safflower oils. The items listed above may not be all the foods and drinks you can have. Talk with an expert in healthy eating called a dietitian to learn more. What foods should I avoid? Fruits Canned fruit in heavy syrup. Fruit in cream or butter sauce. Fried fruit. Vegetables Vegetables cooked in cheese, cream, or butter sauce. Fried vegetables. Grains White bread,  white pasta, or white rice. Cornbread. Bagels, pastries, and croissants. Crackers and snack foods that have trans fat and hydrogenated oils. Meats and other proteins Fatty cuts of meat. Ribs, bacon, chitterlings, or fatback. Hot dogs, bratwurst, sausage, bologna, salami, and packaged lunch meats. Liver and organ meats. Whole eggs and egg yolks. Chicken and turkey with skin. Fried meat. Dairy Whole or 2% milk, cream, or half-and-half. Cream cheese and whole milk cheeses. Whole-fat or sweetened yogurt. Full-fat cheeses. Nondairy creamers and whipped toppings. Processed cheese and cheese spreads. Fried cheese. Fats and oils Butter. Stick margarine. Lard, shortening, or bacon fat. Ghee. Coconut, palm kernel, and palm oils. Drinks Alcohol. Drinks sweetened with sugar, such as: Sodas. Lemonade. Fruit drinks. Sweets and desserts Syrup, corn syrup, sugars, honey, and molasses. Candy. Jam and jelly. Sweetened cereals. Cookies, pies, cakes, donuts, or muffins. Ice cream. The items listed above may not be all the foods and drinks you should avoid. Talk with a dietitian to learn more. This information is not intended to replace advice given to you by your health care provider. Make sure you discuss any questions you have with your health care provider. Document Revised: 02/10/2024 Document Reviewed: 02/10/2024 Elsevier Patient Education  2025 [employer].

## 2024-10-06 NOTE — Progress Notes (Signed)
 " Cardiology Office Note:  .   Date:  10/06/2024  ID:  Danielle Vasquez, DOB 10/16/1928, MRN [id number] PCP: Danielle Norleen PEDLAR, MD  [facility] Providers Cardiologist:  Vina Gull, MD {   History of Present Illness: Danielle Vasquez is a 89 y.o. female  with PMHx of paroxysmal atrial fibrillation, HTN, HLD who reports to [facility] office for 6 month follow up.   Pertinent cardiac medical history:  Echo 05/2017: EF 55 to 60%, trivial AV regurgitation, moderate TV regurgitation Carotid ultrasound 2021: No significant stenosis  Last seen in heartcare 02/2024 by Dr. Gull and reported overall doing well without any complaints.  Noted elevated BP in office and added amlodipine  2.5 mg.  Continued on Eliquis  2.5 mg twice daily, Lipitor 20 mg daily, Zetia 10 mg daily, omega-3 FA 1200 mg daily, HCTZ 25 mg daily, lisinopril 20 mg daily, Lopressor  25 mg daily  Hospitalization in 05/2024 for rectal bleed.  Underwent colonoscopy which showed possible ischemic colitis.  Hgb remained stable.  Amlodipine  held until follow-up with PCP.  Otherwise, continued on Eliquis , lisinopril, Lopressor .  Today,  overall doing well from cardiac standpoint and denies chest pain, shortness of breath, palpitations, syncope, presyncope, dizziness, orthopnea, PND, swelling or significant weight changes, acute bleeding, or claudication.  Reports compliance with medications. PCP reduced amlodipine  to 2.5 mg because she was not able to tolerate 5 mg daily dose. She admits to high cholesterol diet. She resides at [facility]. She walks outside daily.    ROS: 10 point review of system has been reviewed and considered negative except ones been listed in the HPI.   Studies Reviewed: [facility]    ECHO Study Conclusions 05/2017 - Left ventricle: The cavity size was normal. Systolic function was    normal. The estimated ejection fraction was in the range of 55%    to 60%. Wall motion was normal; there were no  regional wall    motion abnormalities. The tissue Doppler parameters were    borderline abnormal for age.  - Aortic valve: There was trivial regurgitation.  - Mitral valve: Systolic bowing without prolapse. There was mild to    moderate regurgitation originating from the central commissure    and directed posteriorly.  - Atrial septum: No defect or patent foramen ovale was identified.  - Tricuspid valve: There was moderate regurgitation directed    centrally.  - Pulmonary arteries: Systolic pressure was mildly increased. PA    peak pressure: 38 mm Hg (S).   CV Studies: Cardiac studies reviewed are outlined and summarized above. Otherwise please see EMR for full report.   Risk Assessment/Calculations:    CHA2DS2-VASc Score = 4  This indicates a 4.8% annual risk of stroke. The patient's score is based upon: CHF History: 0 HTN History: 1 Diabetes History: 0 Stroke History: 0 Vascular Disease History: 0 Age Score: 2 Gender Score: 1  Physical Exam:   VS:  BP 136/68 (Cuff Size: Normal)   Pulse 61   Ht 5' 1 (1.549 m)   Wt 129 lb 3.2 oz (58.6 kg)   SpO2 97%   BMI 24.41 kg/m    Wt Readings from Last 3 Encounters:  10/06/24 129 lb 3.2 oz (58.6 kg)  05/20/24 121 lb (54.9 kg)  03/30/24 121 lb 12.8 oz (55.2 kg)    GEN: Well nourished, well developed in no acute distress while sitting in chair.  NECK: No JVD; No carotid bruits CARDIAC: RRR, no murmurs, rubs,  gallops RESPIRATORY:  Clear to auscultation without rales, wheezing or rhonchi  ABDOMEN: Soft, non-tender, non-distended EXTREMITIES:  No edema; No deformity   ASSESSMENT AND PLAN: .   Labs Reviewed:  - LDL 195 in 09/2024 - K, Cr and CBC WNL in 09/2024.   PAF (paroxysmal atrial fibrillation) (HCC) Reviewed EKG 02/2024: SB, HR 50's, with incomplete RBBB Asymptomatic. Continue on Lopressor  25 mg daily and Eliquis  2.5 mg twice daily (dose appropriate for age 64, weight 58 kg, Cr WNL  in 05/2024)   Hyperlipidemia LDL goal  <100 LDL 195 in 04/7972 Patient prefers to decline any cholesterol lowering medication including PO and IM.  Discussed the risk of elevated cholesterol associated with heart attack.  Patient is aware and understands. Encouraged low cholesterol diet.   Primary hypertension BP this OV initially 140/64 with repeat 136/68 Continue on amlodipine  2.5 mg daily, lisinopril 20 mg daily, Lopressor  20 mg daily. Previously could not tolerate amlodipine  5 mg daily.  Would avoid dose titration of amlodipine . Encourage physical activity for 150 minutes per week and heart healthy low sodium diet. Discussed limiting sodium intake to < 2 grams daily.      Dispo: Follow up in 1 year with Dr. Okey.   Signed, Lorette CINDERELLA Kapur, PA-C  "

## 2024-12-10 ENCOUNTER — Ambulatory Visit: Admitting: Internal Medicine
